# Patient Record
Sex: Male | Born: 1950 | Race: White | Hispanic: No | Marital: Single | Smoking: Never smoker
Health system: Southern US, Community
[De-identification: ages and names within clinical notes are randomized; demographics above are authoritative.]

## PROBLEM LIST (undated history)

## (undated) DIAGNOSIS — G039 Meningitis, unspecified: Secondary | ICD-10-CM

## (undated) DIAGNOSIS — I4892 Unspecified atrial flutter: Secondary | ICD-10-CM

## (undated) DIAGNOSIS — K754 Autoimmune hepatitis: Secondary | ICD-10-CM

## (undated) DIAGNOSIS — C61 Malignant neoplasm of prostate: Secondary | ICD-10-CM

## (undated) HISTORY — DX: Unspecified atrial flutter: I48.92

## (undated) HISTORY — DX: Autoimmune hepatitis: K75.4

---

## 1971-12-30 HISTORY — PX: HYDROCELE EXCISION: SHX482

## 2001-09-07 ENCOUNTER — Encounter: Admission: RE | Admit: 2001-09-07 | Discharge: 2001-09-07 | Payer: Self-pay | Admitting: Family Medicine

## 2001-09-30 ENCOUNTER — Encounter: Admission: RE | Admit: 2001-09-30 | Discharge: 2001-09-30 | Payer: Self-pay | Admitting: Sports Medicine

## 2003-09-19 ENCOUNTER — Encounter: Admission: RE | Admit: 2003-09-19 | Discharge: 2003-09-19 | Payer: Self-pay | Admitting: Family Medicine

## 2003-09-26 ENCOUNTER — Ambulatory Visit (HOSPITAL_COMMUNITY): Admission: RE | Admit: 2003-09-26 | Discharge: 2003-09-26 | Payer: Self-pay | Admitting: Sports Medicine

## 2006-05-28 ENCOUNTER — Ambulatory Visit: Payer: Self-pay | Admitting: Sports Medicine

## 2006-06-19 ENCOUNTER — Ambulatory Visit: Payer: Self-pay | Admitting: Family Medicine

## 2015-09-09 ENCOUNTER — Emergency Department (HOSPITAL_COMMUNITY)
Admission: EM | Admit: 2015-09-09 | Discharge: 2015-09-09 | Discharge status: Against Medical Advice | Payer: Self-pay | Attending: Emergency Medicine | Admitting: Emergency Medicine

## 2015-09-09 ENCOUNTER — Encounter (HOSPITAL_COMMUNITY): Payer: Self-pay | Admitting: Emergency Medicine

## 2015-09-09 DIAGNOSIS — R112 Nausea with vomiting, unspecified: Secondary | ICD-10-CM | POA: Insufficient documentation

## 2015-09-09 DIAGNOSIS — R42 Dizziness and giddiness: Secondary | ICD-10-CM | POA: Insufficient documentation

## 2015-09-09 LAB — COMPREHENSIVE METABOLIC PANEL
ALK PHOS: 79 U/L (ref 38–126)
ALT: 27 U/L (ref 17–63)
AST: 29 U/L (ref 15–41)
Albumin: 4.2 g/dL (ref 3.5–5.0)
Anion gap: 10 (ref 5–15)
BILIRUBIN TOTAL: 0.8 mg/dL (ref 0.3–1.2)
BUN: 17 mg/dL (ref 6–20)
CALCIUM: 9.4 mg/dL (ref 8.9–10.3)
CO2: 24 mmol/L (ref 22–32)
CREATININE: 1.07 mg/dL (ref 0.61–1.24)
Chloride: 105 mmol/L (ref 101–111)
Glucose, Bld: 176 mg/dL — ABNORMAL HIGH (ref 65–99)
Potassium: 4.6 mmol/L (ref 3.5–5.1)
Sodium: 139 mmol/L (ref 135–145)
TOTAL PROTEIN: 7 g/dL (ref 6.5–8.1)

## 2015-09-09 LAB — URINALYSIS, ROUTINE W REFLEX MICROSCOPIC
BILIRUBIN URINE: NEGATIVE
Glucose, UA: NEGATIVE mg/dL
Hgb urine dipstick: NEGATIVE
KETONES UR: 40 mg/dL — AB
NITRITE: NEGATIVE
PH: 7.5 (ref 5.0–8.0)
PROTEIN: 30 mg/dL — AB
Specific Gravity, Urine: 1.029 (ref 1.005–1.030)
UROBILINOGEN UA: 0.2 mg/dL (ref 0.0–1.0)

## 2015-09-09 LAB — CBC
HCT: 43.4 % (ref 39.0–52.0)
Hemoglobin: 14.9 g/dL (ref 13.0–17.0)
MCH: 31.3 pg (ref 26.0–34.0)
MCHC: 34.3 g/dL (ref 30.0–36.0)
MCV: 91.2 fL (ref 78.0–100.0)
PLATELETS: 301 10*3/uL (ref 150–400)
RBC: 4.76 MIL/uL (ref 4.22–5.81)
RDW: 12.7 % (ref 11.5–15.5)
WBC: 11.2 10*3/uL — AB (ref 4.0–10.5)

## 2015-09-09 LAB — URINE MICROSCOPIC-ADD ON

## 2015-09-09 LAB — LIPASE, BLOOD: LIPASE: 18 U/L — AB (ref 22–51)

## 2015-09-09 NOTE — ED Notes (Signed)
Upon being called to a room, Pt was extremely rude to [place] [facility] and colorfully stated that he no longer wants to be seen.

## 2015-09-09 NOTE — ED Notes (Signed)
This am felt dizzy, laid down and took 2 ASA with coffee had emesis 8 no dark emesis. N/v weakness. Went to urgent care and they called ems to transport.

## 2015-09-09 NOTE — ED Notes (Signed)
Bed: EE10 Expected date:  Expected time:  Means of arrival:  Comments: Satcher

## 2015-10-23 ENCOUNTER — Encounter: Payer: Self-pay | Admitting: Neurology

## 2015-10-23 ENCOUNTER — Ambulatory Visit (INDEPENDENT_AMBULATORY_CARE_PROVIDER_SITE_OTHER): Payer: Medicare Other | Admitting: Neurology

## 2015-10-23 VITALS — BP 118/78 | HR 63 | Resp 16 | Wt 169.0 lb

## 2015-10-23 DIAGNOSIS — H8120 Vestibular neuronitis, unspecified ear: Secondary | ICD-10-CM

## 2015-10-23 DIAGNOSIS — R42 Dizziness and giddiness: Secondary | ICD-10-CM | POA: Diagnosis not present

## 2015-10-23 NOTE — Patient Instructions (Signed)
1. Your exam and MRI brain look good 2. Follow-up on as needed basis, call our office for any changes

## 2015-10-23 NOTE — Progress Notes (Signed)
NEUROLOGY CONSULTATION NOTE  Blake [name] MRN: [id number] DOB: [birth year]  Referring provider: Dr. [name] Primary care provider: Dr. [name]  Reason for consult:  dizziness  Dear Dr Arelia Sneddon:  Thank you for your kind referral of Blake [name] for consultation of the above symptoms. Although his history is well known to you, please allow me to reiterate it for the purpose of our medical record.Records and images were personally reviewed where available.  HISTORY OF PRESENT ILLNESS: This is a 64 year old right-handed man with no significant past medical history, in his usual state of health until 09/09/15 while working around the house he suddenly felt dizzy described as "weaviness" and developed a cold sweat. He called his friend to bring him to urgent care, and en route started becoming nauseated and vomiting. He was retching and uncomfortable, and was sent to [facility] In the ambulance, he was started on an IV, unsure if he was given an anti-emetic. He was brought to an exam room and had an EKG, then moved to the waiting room where he was uncomfortable and retching for 4 hours. He was very unhappy with ER care and left, still feeling dizzy and tired, but no further nausea. He felt better the next day, ran 7 miles and had breakfast, then again started having nausea and dry heaving. He lay down on the grass again feeling the same dizziness. He saw his PCP on 09/11/15 and had an MRI brain that day. I personally reviewed MRI brain without contrast which was normal. He reports the dizziness continued for another 10-13 days then self-resolved. He has been asymptomatic for the past 2-3 weeks and feels back to his normal self. He denies any head injuries, recent infections, or recent travels. He did recall that 3 days prior to the incident, he noticed clicking of his jaw every time he would strike his foot while running, improving when he opens his jaw. He denies any headaches,  diplopia, dysarthria, dysphagia, focal numbness/tingling/weakness, bowel/bladder dysfunction. He has some crepitus in his neck.   Laboratory Data: Lab Results  Component Value Date   WBC 11.2* 09/09/2015   HGB 14.9 09/09/2015   HCT 43.4 09/09/2015   MCV 91.2 09/09/2015   PLT 301 09/09/2015     Chemistry      Component Value Date/Time   NA 139 09/09/2015 1233   K 4.6 09/09/2015 1233   CL 105 09/09/2015 1233   CO2 24 09/09/2015 1233   BUN 17 09/09/2015 1233   CREATININE 1.07 09/09/2015 1233      Component Value Date/Time   CALCIUM 9.4 09/09/2015 1233   ALKPHOS 79 09/09/2015 1233   AST 29 09/09/2015 1233   ALT 27 09/09/2015 1233   BILITOT 0.8 09/09/2015 1233      PAST MEDICAL HISTORY: No past medical history on file.  PAST SURGICAL HISTORY: Past Surgical History  Procedure Laterality Date  . Hydrocele excision  1973    MEDICATIONS: No current outpatient prescriptions on file prior to visit.   No current facility-administered medications on file prior to visit.    ALLERGIES: No Known Allergies  FAMILY HISTORY: No family history on file.  SOCIAL HISTORY: Social History   Social History  . Marital Status: Single    Spouse Name: N/A  . Number of Children: N/A  . Years of Education: N/A   Occupational History  . Massage Therapist    Social History Main Topics  . Smoking status: Never Smoker   .  Smokeless tobacco: Never Used  . Alcohol Use: 0.0 oz/week    0 Standard drinks or equivalent per week     Comment: occ  . Drug Use: No  . Sexual Activity: Not on file   Other Topics Concern  . Not on file   Social History Narrative    REVIEW OF SYSTEMS: Constitutional: No fevers, chills, or sweats, no generalized fatigue, change in appetite Eyes: No visual changes, double vision, eye pain Ear, nose and throat: No hearing loss, ear pain, nasal congestion, sore throat Cardiovascular: No chest pain, palpitations Respiratory:  No shortness of breath at  rest or with exertion, wheezes GastrointestinaI: No nausea, vomiting, diarrhea, abdominal pain, fecal incontinence Genitourinary:  No dysuria, urinary retention or frequency Musculoskeletal:  No neck pain, back pain Integumentary: No rash, pruritus, skin lesions Neurological: as above Psychiatric: No depression, insomnia, anxiety Endocrine: No palpitations, fatigue, diaphoresis, mood swings, change in appetite, change in weight, increased thirst Hematologic/Lymphatic:  No anemia, purpura, petechiae. Allergic/Immunologic: no itchy/runny eyes, nasal congestion, recent allergic reactions, rashes  PHYSICAL EXAM: Filed Vitals:   10/23/15 0844  BP: 118/78  Pulse: 63  Resp: 16   General: No acute distress Head:  Normocephalic/atraumatic Eyes: Fundoscopic exam shows bilateral sharp discs, no vessel changes, exudates, or hemorrhages Neck: supple, no paraspinal tenderness, full range of motion Back: No paraspinal tenderness Heart: regular rate and rhythm Lungs: Clear to auscultation bilaterally. Vascular: No carotid bruits. Skin/Extremities: No rash, no edema Neurological Exam: Mental status: alert and oriented to person, place, and time, no dysarthria or aphasia, Fund of knowledge is appropriate.  Recent and remote memory are intact.  Attention and concentration are normal.    Able to name objects and repeat phrases. Cranial nerves: CN I: not tested CN II: pupils equal, round and reactive to light, visual fields intact, fundi unremarkable. CN III, IV, VI:  full range of motion, no nystagmus, no ptosis CN V: facial sensation intact CN VII: upper and lower face symmetric CN VIII: hearing intact to finger rub CN IX, X: gag intact, uvula midline CN XI: sternocleidomastoid and trapezius muscles intact CN XII: tongue midline Bulk & Tone: normal, no fasciculations. Motor: 5/5 throughout with no pronator drift. Sensation: intact to light touch, cold, pin, vibration and joint position sense.   No extinction to double simultaneous stimulation.  Romberg test negative Deep Tendon Reflexes: +2 throughout, no ankle clonus Plantar responses: downgoing bilaterally Cerebellar: no incoordination on finger to nose, heel to shin. No dysdiadochokinesia Gait: narrow-based and steady, able to tandem walk adequately. Tremor: none  IMPRESSION: This is a 64 year old right-handed man with no significant past medical history, who had a 2-week bout of dizziness last month described as "weaviness, not true spinning." This was associated with nausea, dry heaving, and vomiting initially. His brain MRI without contrast is normal, no infarct or mass lesion seen. Symptoms resolved 2-3 weeks ago, his neurological exam today is normal. We discussed that symptoms are suggestive of possible vestibular neuritis. We discussed diagnosis and prognosis, and very low likelihood of recurrence. All his questions were answered to the best of my abilities. He will follow-up on an as needed basis and knows to call our office for any changes.   Thank you for allowing me to participate in the care of this patient. Please do not hesitate to call for any questions or concerns.   Ellouise Newer, M.D.  CC: Dr. Arelia Sneddon

## 2015-10-26 DIAGNOSIS — H8120 Vestibular neuronitis, unspecified ear: Secondary | ICD-10-CM | POA: Insufficient documentation

## 2015-10-26 DIAGNOSIS — R42 Dizziness and giddiness: Secondary | ICD-10-CM | POA: Insufficient documentation

## 2016-10-27 ENCOUNTER — Other Ambulatory Visit: Payer: Self-pay | Admitting: Family Medicine

## 2016-10-27 DIAGNOSIS — R17 Unspecified jaundice: Secondary | ICD-10-CM

## 2016-11-04 ENCOUNTER — Ambulatory Visit
Admission: RE | Admit: 2016-11-04 | Discharge: 2016-11-04 | Disposition: A | Discharge status: Home / Self Care | Payer: Medicare Other | Source: Ambulatory Visit | Attending: Family Medicine | Admitting: Family Medicine

## 2016-11-04 DIAGNOSIS — R17 Unspecified jaundice: Secondary | ICD-10-CM

## 2016-11-06 ENCOUNTER — Other Ambulatory Visit: Payer: Self-pay | Admitting: Family Medicine

## 2016-11-07 ENCOUNTER — Other Ambulatory Visit: Payer: Self-pay | Admitting: Family Medicine

## 2016-11-07 ENCOUNTER — Ambulatory Visit
Admission: RE | Admit: 2016-11-07 | Discharge: 2016-11-07 | Disposition: A | Discharge status: Home / Self Care | Payer: Medicare Other | Source: Ambulatory Visit | Attending: Family Medicine | Admitting: Family Medicine

## 2016-11-07 DIAGNOSIS — K7200 Acute and subacute hepatic failure without coma: Secondary | ICD-10-CM

## 2016-11-07 MED ORDER — GADOBENATE DIMEGLUMINE 529 MG/ML IV SOLN
14.0000 mL | Freq: Once | INTRAVENOUS | Status: AC | PRN
  Administered 2016-11-07: 14 mL via INTRAVENOUS

## 2016-12-29 DIAGNOSIS — Z8679 Personal history of other diseases of the circulatory system: Secondary | ICD-10-CM

## 2016-12-29 HISTORY — DX: Personal history of other diseases of the circulatory system: Z86.79

## 2017-03-26 ENCOUNTER — Other Ambulatory Visit (HOSPITAL_COMMUNITY): Payer: Self-pay | Admitting: Family Medicine

## 2017-03-26 DIAGNOSIS — R188 Other ascites: Secondary | ICD-10-CM

## 2017-03-26 DIAGNOSIS — K754 Autoimmune hepatitis: Secondary | ICD-10-CM

## 2017-03-27 ENCOUNTER — Ambulatory Visit (HOSPITAL_COMMUNITY)
Admission: RE | Admit: 2017-03-27 | Discharge: 2017-03-27 | Disposition: A | Discharge status: Home / Self Care | Payer: Medicare Other | Source: Ambulatory Visit | Attending: Family Medicine | Admitting: Family Medicine

## 2017-03-27 DIAGNOSIS — K754 Autoimmune hepatitis: Secondary | ICD-10-CM | POA: Diagnosis not present

## 2017-03-27 DIAGNOSIS — R188 Other ascites: Secondary | ICD-10-CM | POA: Diagnosis present

## 2017-03-27 HISTORY — PX: IR PARACENTESIS: IMG2679

## 2017-03-27 LAB — LACTATE DEHYDROGENASE, PLEURAL OR PERITONEAL FLUID: LD FL: 56 U/L — AB (ref 3–23)

## 2017-03-27 LAB — GRAM STAIN

## 2017-03-27 LAB — AMYLASE, PLEURAL OR PERITONEAL FLUID: AMYLASE FL: 44 U/L

## 2017-03-27 LAB — GLUCOSE, PLEURAL OR PERITONEAL FLUID: Glucose, Fluid: 151 mg/dL

## 2017-03-27 LAB — BODY FLUID CELL COUNT WITH DIFFERENTIAL
LYMPHS FL: 10 %
MONOCYTE-MACROPHAGE-SEROUS FLUID: 85 % (ref 50–90)
NEUTROPHIL FLUID: 5 % (ref 0–25)
WBC FLUID: 117 uL (ref 0–1000)

## 2017-03-27 LAB — PROTEIN, PLEURAL OR PERITONEAL FLUID: Total protein, fluid: <3 g/dL

## 2017-03-27 NOTE — Procedures (Signed)
Ultrasound-guided diagnostic and therapeutic paracentesis performed yielding 4.3 liters of turbid, yellow fluid. No immediate complications. A portion of the fluid was submitted to the lab for preordered studies.

## 2017-04-01 DIAGNOSIS — K7460 Unspecified cirrhosis of liver: Secondary | ICD-10-CM | POA: Insufficient documentation

## 2017-04-01 DIAGNOSIS — R188 Other ascites: Secondary | ICD-10-CM | POA: Insufficient documentation

## 2017-04-01 LAB — CULTURE, BODY FLUID W GRAM STAIN -BOTTLE

## 2017-04-01 LAB — CULTURE, BODY FLUID-BOTTLE: CULTURE: NO GROWTH

## 2017-04-28 DIAGNOSIS — B457 Disseminated cryptococcosis: Secondary | ICD-10-CM

## 2017-04-28 HISTORY — DX: Disseminated cryptococcosis: B45.7

## 2017-05-21 DIAGNOSIS — B451 Cerebral cryptococcosis: Secondary | ICD-10-CM | POA: Insufficient documentation

## 2017-08-04 ENCOUNTER — Telehealth: Payer: Self-pay | Admitting: Cardiovascular Disease

## 2017-08-04 NOTE — Telephone Encounter (Signed)
08/04/17-Received incoming records from [facility] for upcoming appointment on 08/26/17 @ 2:20pm with Dr. Oval Linsey. Records given to [facility] in Medical Records. ab

## 2017-08-07 ENCOUNTER — Telehealth: Payer: Self-pay | Admitting: Cardiovascular Disease

## 2017-08-07 NOTE — Telephone Encounter (Signed)
Received incoming records from [facility] for upcoming appointment on 08/26/17 @ 2:20pm with Dr. Oval Linsey. Records given to [facility] in Medical Records. 08/07/17 ab

## 2017-08-26 ENCOUNTER — Ambulatory Visit (INDEPENDENT_AMBULATORY_CARE_PROVIDER_SITE_OTHER): Payer: Medicare Other | Admitting: Cardiovascular Disease

## 2017-08-26 ENCOUNTER — Encounter: Payer: Self-pay | Admitting: Cardiovascular Disease

## 2017-08-26 VITALS — BP 125/78 | HR 73 | Ht 68.0 in | Wt 157.0 lb

## 2017-08-26 DIAGNOSIS — I4892 Unspecified atrial flutter: Secondary | ICD-10-CM | POA: Diagnosis not present

## 2017-08-26 DIAGNOSIS — I483 Typical atrial flutter: Secondary | ICD-10-CM

## 2017-08-26 DIAGNOSIS — K754 Autoimmune hepatitis: Secondary | ICD-10-CM | POA: Diagnosis not present

## 2017-08-26 MED ORDER — METOPROLOL TARTRATE 25 MG PO TABS: ORAL_TABLET | ORAL | 5 refills | Status: DC

## 2017-08-26 NOTE — Progress Notes (Signed)
Cardiology Office Note   Date:  08/28/2017   ID:  Blake, [name] [birth year], MRN [id number]  PCP:  Hayden Rasmussen, MD  Cardiologist:   Skeet Latch, MD  Gastroenterologist: Dr. [place] Campbell [facility])   No chief complaint on file.    History of Present Illness: Blake [name] is a 66 y.o. male with paroxysmal atrial fiutter, disseminated cryptococcal infection, and autoimmune hepatitis who is being seen today for the evaluation of atrial flutter at the request of Blake [name], *.  Blake [name] has been on high dose steroids for the management of autoimmune hepatitis which was diagnosed in early 2018.  He has required one diagnostic and one therapeutic paracentesis.  He had a prolonged hospitalization at [facility] 04/2017 with disseminated cryptococcus (CNS, pulmonary and L parotid).  During that hospitalization he was noted to be in atrial flutter by EKG on 05/19/17.  His hospitalization was also complicated by mouth ulcers and colitis.  His symptoms have all resolved with the use of prednisone.  He was ill for several weeks prior to finding out that cryptococcus was the source of his infection.  During that time prior to his hospitalization he was noted to be in atrial flutter with his PCP.  He was not started on anticoagulation and his heart rate was well-controlled.  In retrospect he thinks that he may be been in and out of atrial flutter prior to these findings.  At times his heart rate monitor alerted him that his heart rate was 150 bpm while at rest.  He was otherwise unaware of his arrhythmia. At times he notes dizziness and wonders if this could be the atrial flutter.  At baseline his heart rate is usually in the 60s.  He did not recall having an echo.  While in the hospital he had lower extremity edema, but this has resolved.    Prior to his diagnosis of autoimmune hepatitis [name] was very active.  He enjoyed running half marathons.  He started back running two weeks ago and is back  up to 4-5 miles.  He is happy that he is starting to regain muscle tone.  He is weaning prednisone and is currently taking 10 mg daily.  He has noted easy bruising and bleeding, which he attributes to the prednisone.  His liver function had normalized when recently checked by his gastroenterologist.     Past Medical History:  Diagnosis Date  . Atrial flutter ([place]) 08/28/2017  . Autoimmune hepatitis ([place]) 08/28/2017    Past Surgical History:  Procedure Laterality Date  . HYDROCELE EXCISION  1973     Current Outpatient Prescriptions  Medication Sig Dispense Refill  . fluconazole (DIFLUCAN) 200 MG tablet Take 1 tablet by mouth daily.  0  . predniSONE (DELTASONE) 10 MG tablet Take 10 mg by mouth daily.  2  . spironolactone (ALDACTONE) 25 MG tablet Take 50 mg by mouth daily.    . metoprolol tartrate (LOPRESSOR) 25 MG tablet 1/2 TABLET BY MOUTH TWICE A DAY 30 tablet 5   No current facility-administered medications for this visit.     Allergies:   Patient has no known allergies.    Social History:  The patient  reports that he has never smoked. He has never used smokeless tobacco. He reports that he drinks alcohol. He reports that he does not use drugs.   Family History:  The patient's family history includes Alzheimer's disease in his father; Breast cancer in his sister; Heart  disease in his father; Hypertension in his brother; Liver disease in his sister; Parkinson's disease in his mother; Prostate cancer in his father; Ulcerative colitis in his sister.    ROS:  Please see the history of present illness.   Otherwise, review of systems are positive for none.   All other systems are reviewed and negative.    PHYSICAL EXAM: VS:  BP 125/78   Pulse 73   Ht 5\' 8"  (1.727 m)   Wt 71.2 kg (157 lb)   BMI 23.87 kg/m  , BMI Body mass index is 23.87 kg/m. GENERAL:  Well appearing HEENT:  Pupils equal round and reactive, fundi not visualized, oral mucosa unremarkable NECK:  No jugular venous  distention, waveform within normal limits, carotid upstroke brisk and symmetric, no bruits LUNGS:  Clear to auscultation bilaterally.  No crackles, wheezes or rhonchi HEART:  RRR.  PMI not displaced or sustained,S1 and S2 within normal limits, no S3, no S4, no clicks, no rubs, no murmurs ABD:  Mildly distended.  +fluid wave.  Positive bowel sounds normal in frequency in pitch, no bruits, no rebound, no guarding, no midline pulsatile mass, no hepatomegaly, no splenomegaly.  Umbilical hernia. EXT:  2 plus pulses throughout, no edema, no cyanosis no clubbing SKIN:  No rashes no nodules NEURO:  Cranial nerves II through XII grossly intact, motor grossly intact throughout PSYCH:  Cognitively intact, oriented to person place and time   EKG:  EKG is ordered today. The ekg ordered 08/26/17 demonstrates sinus rhythm.  Rate 129 bpm.   04/11/17: Atrial flutter with variable ventricular response   Recent Labs: No results found for requested labs within last 8760 hours.    Lipid Panel No results found for: CHOL, TRIG, HDL, CHOLHDL, VLDL, LDLCALC, LDLDIRECT    Wt Readings from Last 3 Encounters:  08/26/17 71.2 kg (157 lb)  10/23/15 76.7 kg (169 lb)      ASSESSMENT AND PLAN:  # Paroxysmal atrial flutter:  Blake [name] has experienced episodes of atrial flutter.  These occurred in the setting of disseminated cryptococcal infection.  It is unclear if this was going on prior to his infection.  His liver function (and presumably his clotting factors) have recently returned to normal.  He has normal platelet counts.  He is currently in sinus rhythm today.  We will get an echo and check TSH and free T4.  He has also experienced episodes of RVR.  We will start metoprolol tartrate 12.5mg  bid.  Given his CHA2DS2-Vasc score of 1, his overall risk of stroke is pretty low.  While he is on prednisone, his risk of bleeding is significantly higher.  He is covered in ecchymoses and bleeds.  Prednisone is being actively  weaned. We will wait for him to get off prednisone prior to starting anticoagulation.   This patients CHA2DS2-VASc Score and unadjusted Ischemic Stroke Rate (% per year) is equal to 0.6 % stroke rate/year from a score of 1  Above score calculated as 1 point each if present [CHF, HTN, DM, Vascular=MI/PAD/Aortic Plaque, Age if 65-74, or Male] Above score calculated as 2 points each if present [Age > 75, or Stroke/TIA/TE]     Current medicines are reviewed at length with the patient today.  The patient does not have concerns regarding medicines.  The following changes have been made:  Start metoprolol.  Labs/ tests ordered today include:   Orders Placed This Encounter  Procedures  . T4, free  . TSH  . EKG 12-Lead  .  ECHOCARDIOGRAM COMPLETE     Disposition:   FU with Zareya Tuckett C. Oval Linsey, MD, [facility] in 2 months.    This note was written with the assistance of speech recognition software.  Please excuse any transcriptional errors.  Signed, Dreya Buhrman C. Oval Linsey, MD, [facility]  08/28/2017 9:29 AM    [place]

## 2017-08-26 NOTE — Patient Instructions (Addendum)
Medication Instructions:  START METOPROLOL TARTRATE 25 MG 1/2 TABLET TWICE A DAY   Labwork: TSH/FT4 WHEN YOU GET YOUR ECHO    Testing/Procedures: Your physician has requested that you have an echocardiogram. Echocardiography is a painless test that uses sound waves to create images of your heart. It provides your doctor with information about the size and shape of your heart and how well your heart's chambers and valves are working. This procedure takes approximately one hour. There are no restrictions for this procedure. [address]   Follow-Up: Your physician recommends that you schedule a follow-up appointment in: 2 MONTH OV   If you need a refill on your cardiac medications before your next appointment, please call your pharmacy.  Echocardiogram An echocardiogram, or echocardiography, uses sound waves (ultrasound) to produce an image of your heart. The echocardiogram is simple, painless, obtained within a short period of time, and offers valuable information to your health care provider. The images from an echocardiogram can provide information such as:  Evidence of coronary artery disease (CAD).  Heart size.  Heart muscle function.  Heart valve function.  Aneurysm detection.  Evidence of a past heart attack.  Fluid buildup around the heart.  Heart muscle thickening.  Assess heart valve function.  Tell a health care provider about:  Any allergies you have.  All medicines you are taking, including vitamins, herbs, eye drops, creams, and over-the-counter medicines.  Any problems you or family members have had with anesthetic medicines.  Any blood disorders you have.  Any surgeries you have had.  Any medical conditions you have.  Whether you are pregnant or may be pregnant. What happens before the procedure? No special preparation is needed. Eat and drink normally. What happens during the procedure?  In order to produce an image  of your heart, gel will be applied to your chest and a wand-like tool (transducer) will be moved over your chest. The gel will help transmit the sound waves from the transducer. The sound waves will harmlessly bounce off your heart to allow the heart images to be captured in real-time motion. These images will then be recorded.  You may need an IV to receive a medicine that improves the quality of the pictures. What happens after the procedure? You may return to your normal schedule including diet, activities, and medicines, unless your health care provider tells you otherwise. This information is not intended to replace advice given to you by your health care provider. Make sure you discuss any questions you have with your health care provider. Document Released: 12/12/2000 Document Revised: 08/02/2016 Document Reviewed: 08/22/2013 Elsevier Interactive Patient Education  2017 [employer].

## 2017-08-28 ENCOUNTER — Encounter: Payer: Self-pay | Admitting: Cardiovascular Disease

## 2017-08-28 DIAGNOSIS — K754 Autoimmune hepatitis: Secondary | ICD-10-CM

## 2017-08-28 DIAGNOSIS — I4892 Unspecified atrial flutter: Secondary | ICD-10-CM

## 2017-08-28 HISTORY — DX: Autoimmune hepatitis: K75.4

## 2017-08-28 HISTORY — DX: Unspecified atrial flutter: I48.92

## 2017-09-04 ENCOUNTER — Other Ambulatory Visit: Payer: Medicare Other | Admitting: *Deleted

## 2017-09-04 ENCOUNTER — Other Ambulatory Visit: Payer: Self-pay

## 2017-09-04 ENCOUNTER — Ambulatory Visit (HOSPITAL_COMMUNITY): Payer: Medicare Other | Attending: Cardiology

## 2017-09-04 DIAGNOSIS — I4892 Unspecified atrial flutter: Secondary | ICD-10-CM

## 2017-09-04 DIAGNOSIS — I080 Rheumatic disorders of both mitral and aortic valves: Secondary | ICD-10-CM | POA: Diagnosis not present

## 2017-09-04 LAB — TSH: TSH: 1.34 u[IU]/mL (ref 0.450–4.500)

## 2017-09-04 LAB — T4, FREE: FREE T4: 0.91 ng/dL (ref 0.82–1.77)

## 2017-10-01 DIAGNOSIS — N3090 Cystitis, unspecified without hematuria: Secondary | ICD-10-CM | POA: Insufficient documentation

## 2017-10-26 ENCOUNTER — Ambulatory Visit: Payer: Medicare Other | Admitting: Cardiovascular Disease

## 2017-11-13 ENCOUNTER — Ambulatory Visit: Payer: Medicare Other | Admitting: Cardiovascular Disease

## 2017-11-13 ENCOUNTER — Encounter: Payer: Self-pay | Admitting: Cardiovascular Disease

## 2017-11-13 VITALS — BP 90/58 | HR 70 | Ht 68.0 in | Wt 156.0 lb

## 2017-11-13 DIAGNOSIS — I4892 Unspecified atrial flutter: Secondary | ICD-10-CM | POA: Diagnosis not present

## 2017-11-13 NOTE — Progress Notes (Signed)
Cardiology Office Note   Date:  11/15/2017   ID:  Blake [name], DOB Dec 16, 1951, MRN [id number]  PCP:  Hayden Rasmussen, MD  Cardiologist:   Skeet Latch, MD  Gastroenterologist: Dr. Richmond Campbell [facility])   Chief Complaint  [name] presents with  . Follow-up     History of Present Illness: Blake [name] is a 66 y.o. male with paroxysmal atrial fiutter, disseminated cryptococcal infection, and autoimmune hepatitis here for follow-up.  He was initially seen for atrial flutter 07/2017. Blake [name] has been on high dose steroids for Blake management of autoimmune hepatitis which was diagnosed in early 2018.  He has required one diagnostic and one therapeutic paracentesis.  He had a prolonged hospitalization at [facility] 04/2017 with disseminated cryptococcus (CNS, pulmonary and L parotid).  During that hospitalization he was noted to be in atrial flutter by EKG on 05/19/17.  His hospitalization was also complicated by mouth ulcers and colitis.  His symptoms have all resolved with Blake use of prednisone.  He was ill for several weeks prior to finding out that cryptococcus was Blake source of his infection.  During that time prior to his hospitalization he was noted to be in atrial flutter with his PCP.  He was not started on anticoagulation and his heart rate was well-controlled.    At his last appointment Blake [name] was not started on anticoagulation because he was on chronic prednisone.  He was referred for an echo 09/04/17 that revealed LVEF 55-60% with grade 1 diastolic dysfunction, mild aortic regurgitation, and mild mitral regurgitation.  Since that appointment he has been hospitalized 3 times at [facility] for cystitis and sepsis.  Each time he failed oral antibiotic after discharge.  He was most recently discharged 10/19/17.  That hospitalization was complicated by acute renal failure with a creatinine that rose to 2.6 and improved to 1.46 at discharge.  Lasix and spironolactone were  held during his hospitalization.  This time he was discharged on both doxycycline and ciprofloxacin.  He has been feeling well.  He reports that his blood pressure has still been low.  He has some mild lightheadedness and dizziness but no syncope.  He continues to have urinary urgency but no dysuria.  He checked his blood pressure at Blake drugstore recently and it was 120/80.  He has not experienced any chest pain or palpitations but still feels weak.  He plans to see Dr. Earlean Shawl next week to discuss Blake plans for his autoimmune hepatitis treatment.  For now he continues on prednisone.  He has not had any increased lower extremity edema.  His ascites is stable.  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 Hi BP at home has been mostly in Blake 867E systolic.  He has occasional lightheadedness but no syncope.   Past Medical History:  Diagnosis Date  . Atrial flutter ([place]) 08/28/2017  . Autoimmune hepatitis ([place]) 08/28/2017    Past Surgical History:  Procedure Laterality Date  . HYDROCELE EXCISION  1973     Current Outpatient Medications  Medication Sig Dispense Refill  . acetaminophen (TYLENOL) 325 MG tablet Take 650 mg every 12 (twelve) hours as needed by mouth.    . ciprofloxacin (CIPRO) 500 MG tablet Take 500 mg 2 (two) times daily by mouth.    . doxycycline (VIBRA-TABS) 100 MG tablet Take 100 mg 2 (two) times daily by mouth.    Marland Kitchen  fluconazole (DIFLUCAN) 200 MG tablet Take 1 tablet by mouth daily.  0  . metoprolol tartrate (LOPRESSOR) 25 MG tablet Take 25 mg as directed by mouth. 1/2 tablet by mouth twice a day    . Multiple Vitamin (MULTIVITAMIN) tablet Take 1 tablet daily by mouth.    . predniSONE (DELTASONE) 5 MG tablet Take 5 mg daily by mouth.  1  . tamsulosin (FLOMAX) 0.4 MG CAPS capsule Take 0.4 mg daily by mouth.     No current facility-administered medications for this visit.     Allergies:   [name] has no known allergies.    Social  History:  Blake [name]  reports that  has never smoked. he has never used smokeless tobacco. He reports that he drinks alcohol. He reports that he does not use drugs.   Family History:  Blake [name]'s family history includes Alzheimer's disease in his father; Breast cancer in his sister; Heart disease in his father; Hypertension in his brother; Liver disease in his sister; Parkinson's disease in his mother; Prostate cancer in his father; Ulcerative colitis in his sister.    ROS:  Please see Blake history of present illness.   Otherwise, review of systems are positive for none.   All other systems are reviewed and negative.    PHYSICAL EXAM: VS:  BP (!) 90/58   Pulse 70   Ht 5\' 8"  (1.727 m)   Wt 70.8 kg (156 lb)   BMI 23.72 kg/m  , BMI Body mass index is 23.72 kg/m. GENERAL:  Chronically ill-appearing HEENT: Pupils equal round and reactive, fundi not visualized, oral mucosa unremarkable NECK:  No jugular venous distention, waveform within normal limits, carotid upstroke brisk and symmetric, no bruits, no thyromegaly LUNGS:  Clear to auscultation bilaterally HEART:  RRR.  PMI not displaced or sustained,S1 and S2 within normal limits, no S3, no S4, no clicks, no rubs, no murmurs ABD:  +Abdominal distension, positive bowel sounds normal in frequency in pitch, no bruits, no rebound, no guarding, no midline pulsatile mass, no hepatomegaly, no splenomegaly EXT:  2 plus pulses throughout, no edema, no cyanosis no clubbing SKIN:  No rashes no nodules.  Multiple ecchymoses. NEURO:  Cranial nerves II through XII grossly intact, motor grossly intact throughout PSYCH:  Cognitively intact, oriented to person place and time   EKG:  EKG is ordered today. Blake ekg ordered 08/26/17 demonstrates sinus rhythm.  Rate 129 bpm.   04/11/17: Atrial flutter with variable ventricular response  Echo 09/04/17: Study Conclusions  - Left ventricle: Blake cavity size was normal. There was mild focal   basal hypertrophy of  Blake septum. Systolic function was normal.   Blake estimated ejection fraction was in Blake range of 55% to 60%.   Wall motion was normal; there were no regional wall motion   abnormalities. There was an increased relative contribution of   atrial contraction to ventricular filling. Doppler parameters are   consistent with abnormal left ventricular relaxation (grade 1   diastolic dysfunction). - Aortic valve: Mildly calcified annulus. Trileaflet; normal   thickness leaflets. There was mild regurgitation. - Mitral valve: There was mild regurgitation.   Recent Labs: 09/04/2017: TSH 1.340    Lipid Panel No results found for: CHOL, TRIG, HDL, CHOLHDL, VLDL, LDLCALC, LDLDIRECT    Wt Readings from Last 3 Encounters:  11/13/17 70.8 kg (156 lb)  08/26/17 71.2 kg (157 lb)  10/23/15 76.7 kg (169 lb)      ASSESSMENT AND PLAN:  # Paroxysmal atrial flutter:  No recent episodes.  He has been ill recently with multiple episode of cystitis and bacteremia.  He remains on prednisone for now.  If he gets off this we will discuss anticoagulation.  We discussed reducing metoprolol to once daily.  He is stable for now but can change if symptoms worsen. This patients CHA2DS2-VASc Score and unadjusted Ischemic Stroke Rate (% per year) is equal to 0.6 % stroke rate/year from a score of 1  Above score calculated as 1 point each if present [CHF, HTN, DM, Vascular=MI/PAD/Aortic Plaque, Age if 65-74, or Male] Above score calculated as 2 points each if present [Age > 75, or Stroke/TIA/TE]     Current medicines are reviewed at length with Blake [name] today.  Blake [name] does not have concerns regarding medicines.  Blake following changes have been made: none  Labs/ tests ordered today include:   No orders of Blake defined types were placed in this encounter.    Disposition:   FU with Blake Stoltzfus C. Oval Linsey, MD, [facility] in 4 months.    This note was written with Blake assistance of speech recognition software.   Please excuse any transcriptional errors.  Signed, Rhilynn Preyer C. Oval Linsey, MD, [facility]  11/15/2017 9:46 AM    [facility]

## 2017-11-13 NOTE — Patient Instructions (Signed)
Medication Instructions:  Your physician recommends that you continue on your current medications as directed. Please refer to the Current Medication list given to you today.  Labwork: none  Testing/Procedures: none  Follow-Up: Your physician recommends that you schedule a follow-up appointment in: 4 month ov   If you need a refill on your cardiac medications before your next appointment, please call your pharmacy.  

## 2017-11-15 ENCOUNTER — Encounter: Payer: Self-pay | Admitting: Cardiovascular Disease

## 2018-03-12 ENCOUNTER — Ambulatory Visit: Payer: Medicare Other | Admitting: Cardiovascular Disease

## 2018-03-13 DIAGNOSIS — M60021 Infective myositis, right upper arm: Secondary | ICD-10-CM | POA: Insufficient documentation

## 2018-03-13 DIAGNOSIS — N179 Acute kidney failure, unspecified: Secondary | ICD-10-CM | POA: Insufficient documentation

## 2018-03-20 ENCOUNTER — Other Ambulatory Visit: Payer: Self-pay | Admitting: Cardiovascular Disease

## 2018-03-22 NOTE — Telephone Encounter (Signed)
Please review for refill, Thanks !  

## 2019-02-22 ENCOUNTER — Other Ambulatory Visit: Payer: Self-pay | Admitting: Family Medicine

## 2019-02-24 ENCOUNTER — Other Ambulatory Visit: Payer: Self-pay | Admitting: Family Medicine

## 2019-02-24 DIAGNOSIS — K7460 Unspecified cirrhosis of liver: Secondary | ICD-10-CM

## 2019-08-10 ENCOUNTER — Other Ambulatory Visit (HOSPITAL_COMMUNITY): Payer: Self-pay | Admitting: Internal Medicine

## 2019-08-10 ENCOUNTER — Other Ambulatory Visit: Payer: Self-pay | Admitting: Internal Medicine

## 2019-08-10 DIAGNOSIS — K754 Autoimmune hepatitis: Secondary | ICD-10-CM

## 2019-08-10 DIAGNOSIS — R188 Other ascites: Secondary | ICD-10-CM

## 2019-08-16 ENCOUNTER — Other Ambulatory Visit: Payer: Self-pay

## 2019-08-16 ENCOUNTER — Ambulatory Visit (HOSPITAL_COMMUNITY)
Admission: RE | Admit: 2019-08-16 | Discharge: 2019-08-16 | Disposition: A | Discharge status: Home / Self Care | Payer: Medicare Other | Source: Ambulatory Visit | Attending: Internal Medicine | Admitting: Internal Medicine

## 2019-08-16 ENCOUNTER — Other Ambulatory Visit (HOSPITAL_COMMUNITY): Payer: Self-pay | Admitting: Internal Medicine

## 2019-08-16 DIAGNOSIS — K754 Autoimmune hepatitis: Secondary | ICD-10-CM

## 2019-08-16 DIAGNOSIS — Z8719 Personal history of other diseases of the digestive system: Secondary | ICD-10-CM | POA: Insufficient documentation

## 2019-08-16 DIAGNOSIS — D1803 Hemangioma of intra-abdominal structures: Secondary | ICD-10-CM | POA: Insufficient documentation

## 2019-08-16 DIAGNOSIS — N281 Cyst of kidney, acquired: Secondary | ICD-10-CM | POA: Diagnosis not present

## 2019-08-16 DIAGNOSIS — R188 Other ascites: Secondary | ICD-10-CM

## 2019-08-16 NOTE — Progress Notes (Signed)
IR requested by Rolene Course, PA-C for possible image-guided paracentesis.  Limited abdominal ultrasound revealed no fluid that could be safely accessed with procedure today. Images sent to Dr. Earleen Newport for review. Informed patient that procedure will not occur today. All questions answered and concerns addressed. Will make Rolene Course, PA-C aware.  IR available in future if needed.   Bea Graff Louk, PA-C 08/16/2019, 9:37 AM

## 2019-11-23 DIAGNOSIS — B999 Unspecified infectious disease: Secondary | ICD-10-CM | POA: Insufficient documentation

## 2020-10-23 DIAGNOSIS — K5790 Diverticulosis of intestine, part unspecified, without perforation or abscess without bleeding: Secondary | ICD-10-CM | POA: Insufficient documentation

## 2021-03-29 HISTORY — PX: OTHER SURGICAL HISTORY: SHX169

## 2021-08-29 DIAGNOSIS — R972 Elevated prostate specific antigen [PSA]: Secondary | ICD-10-CM

## 2021-08-29 HISTORY — DX: Elevated prostate specific antigen (PSA): R97.20

## 2021-10-17 ENCOUNTER — Other Ambulatory Visit: Payer: Self-pay | Admitting: Urology

## 2021-10-17 DIAGNOSIS — R972 Elevated prostate specific antigen [PSA]: Secondary | ICD-10-CM

## 2021-11-01 ENCOUNTER — Other Ambulatory Visit: Payer: Medicare Other

## 2021-11-13 ENCOUNTER — Ambulatory Visit
Admission: RE | Admit: 2021-11-13 | Discharge: 2021-11-13 | Disposition: A | Discharge status: Home / Self Care | Payer: Medicare Other | Source: Ambulatory Visit | Attending: Urology | Admitting: Urology

## 2021-11-13 DIAGNOSIS — R972 Elevated prostate specific antigen [PSA]: Secondary | ICD-10-CM

## 2021-11-13 MED ORDER — GADOBENATE DIMEGLUMINE 529 MG/ML IV SOLN
15.0000 mL | Freq: Once | INTRAVENOUS | Status: AC | PRN
  Administered 2021-11-13: 15 mL via INTRAVENOUS

## 2021-12-24 ENCOUNTER — Other Ambulatory Visit (HOSPITAL_COMMUNITY): Payer: Self-pay | Admitting: Urology

## 2021-12-24 DIAGNOSIS — C61 Malignant neoplasm of prostate: Secondary | ICD-10-CM

## 2021-12-25 ENCOUNTER — Telehealth: Payer: Self-pay | Admitting: Radiation Oncology

## 2021-12-26 ENCOUNTER — Telehealth: Payer: Self-pay | Admitting: Radiation Oncology

## 2022-01-07 ENCOUNTER — Encounter (HOSPITAL_COMMUNITY)
Admission: RE | Admit: 2022-01-07 | Discharge: 2022-01-07 | Disposition: A | Discharge status: Home / Self Care | Payer: Medicare Other | Source: Ambulatory Visit | Attending: Urology | Admitting: Urology

## 2022-01-07 ENCOUNTER — Other Ambulatory Visit: Payer: Self-pay

## 2022-01-07 DIAGNOSIS — C61 Malignant neoplasm of prostate: Secondary | ICD-10-CM | POA: Insufficient documentation

## 2022-01-07 MED ORDER — PIFLIFOLASTAT F 18 (PYLARIFY) INJECTION
9.0000 | Freq: Once | INTRAVENOUS | Status: AC
  Administered 2022-01-07: 9.8 via INTRAVENOUS

## 2022-01-23 NOTE — Progress Notes (Signed)
GU Location of Tumor / Histology: Prostate Ca  If Prostate Cancer, Gleason Score is (5 + 4) and PSA is (9.84 as of 12/22)  Biopsies  Dr. Junious Silk        Past/Anticipated interventions by urology, if any:   Weight changes, if any:  No  IPSS:   14 SHIM:  9  Bowel/Bladder complaints, if any:  No bowel issue and urinary frequency.  Nausea/Vomiting, if any:  No  Pain issues, if any:  0/10  SAFETY ISSUES: Prior radiation?  No Pacemaker/ICD?  No Possible current pregnancy?  Male Is the patient on methotrexate?  No  Current Complaints / other details:  Need more information on treatment.

## 2022-01-28 ENCOUNTER — Ambulatory Visit
Admission: RE | Admit: 2022-01-28 | Discharge: 2022-01-28 | Disposition: A | Discharge status: Home / Self Care | Payer: Medicare Other | Source: Ambulatory Visit | Attending: Radiation Oncology | Admitting: Radiation Oncology

## 2022-01-28 ENCOUNTER — Other Ambulatory Visit: Payer: Self-pay

## 2022-01-28 VITALS — BP 125/84 | HR 60 | Temp 97.8°F | Resp 20 | Ht 68.0 in | Wt 165.0 lb

## 2022-01-28 DIAGNOSIS — C61 Malignant neoplasm of prostate: Secondary | ICD-10-CM | POA: Insufficient documentation

## 2022-01-28 DIAGNOSIS — Z803 Family history of malignant neoplasm of breast: Secondary | ICD-10-CM | POA: Diagnosis not present

## 2022-01-28 DIAGNOSIS — Z8042 Family history of malignant neoplasm of prostate: Secondary | ICD-10-CM | POA: Diagnosis not present

## 2022-01-28 DIAGNOSIS — I480 Paroxysmal atrial fibrillation: Secondary | ICD-10-CM | POA: Diagnosis not present

## 2022-01-28 DIAGNOSIS — K754 Autoimmune hepatitis: Secondary | ICD-10-CM | POA: Insufficient documentation

## 2022-01-28 DIAGNOSIS — N281 Cyst of kidney, acquired: Secondary | ICD-10-CM | POA: Insufficient documentation

## 2022-01-28 DIAGNOSIS — I4892 Unspecified atrial flutter: Secondary | ICD-10-CM | POA: Diagnosis not present

## 2022-01-28 NOTE — Progress Notes (Signed)
Introduced myself to the patient as the prostate nurse navigator.  No barriers to care identified at this time.  He is here to discuss his radiation treatment options.  I gave him my business card and asked him to call me with questions or concerns.  Verbalized understanding.  ?

## 2022-01-28 NOTE — Progress Notes (Signed)
Radiation Oncology         [phone] ________________________________  Initial Outpatient Consultation  Name: Blake [name] MRN: [id number]  Date: 01/28/2022  DOB: Jan 11, 1951  XN:[id number], Blake Munroe, MD  Festus Aloe, MD   REFERRING PHYSICIAN: Festus Aloe, MD  DIAGNOSIS: 71 y.o. 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    ICD-10-CM   1. Malignant neoplasm of prostate ([place])  C61       HISTORY OF PRESENT ILLNESS: Blake [name] is a 71 y.o. male with a diagnosis of prostate cancer. He has a history of elevated PSA at 6.8 in 2013 but a repeat PSA normalized to 4.11 and had remained in the normal range since that time. However, more more recently, he was noted to have an elevated PSA of 8.35 in 04/2021, by his primary care physician, Dr. Darron Doom.  Accordingly, he was referred for evaluation in urology by Dr. Junious Silk on 10/04/21,  digital rectal examination was performed at that time revealing no nodules. Repeat PSA that day showed further elevation to 9.84. He underwent prostate MRI on 11/13/21 showing a PI-RADS 4 lesion in the left posterolateral and posteromedial peripheral zone of the mid gland and apex, with potential extension into the left central zone and left seminal vesicle with >1.5 cm of capsular abutment, which can increase risk of occult transcapsular spread.  There was also nonspecific sclerosis in the left lower pubic body, likely degenerative and red marrow redistribution, felt to be nonspecific. The patient proceeded to transrectal ultrasound with 12 biopsies of the prostate on 12/11/21 and no specific lesion was noted on [place] to correlate with the MRI lesion.  The prostate volume measured 58.78 cc.  Out of 12 core biopsies, 5 were positive, all on the left.  The maximum Gleason score was 4+5, and this was seen in the base. Additionally, Gleason 4+4 was seen in the left base lateral, left apex lateral, left mid, and left  apex.  A PSMA scan was performed on 01/07/22 for disease staging and this showed focal activity in the left lobe of the prostate gland, correlating with MRI but without evidence of metastatic adenopathy, or visceral or skeletal metastasis.  The patient reviewed the biopsy results with his urologist and he has kindly been referred today for discussion of potential radiation treatment options. He was started on Orgovyx ADT 01/20/22 and is tolerating this well.  PREVIOUS RADIATION THERAPY: No  PAST MEDICAL HISTORY:  Past Medical History:  Diagnosis Date   Atrial flutter ([place]) 08/28/2017   Autoimmune hepatitis ([place]) 08/28/2017      PAST SURGICAL HISTORY: Past Surgical History:  Procedure Laterality Date   HYDROCELE EXCISION  1973    FAMILY HISTORY:  Family History  Problem Relation Age of Onset   Parkinson's disease Mother    Heart disease Father    Prostate cancer Father    Alzheimer's disease Father    Breast cancer Sister    Ulcerative colitis Sister    Liver disease Sister    Hypertension Brother     SOCIAL HISTORY: He enjoys running half and full marathons and is currently training for his next marathon.  Social History   Socioeconomic History   Marital status: Single    Spouse name: Not on file   Number of children: Not on file   Years of education: Not on file   Highest education level: Not on file  Occupational History   Occupation: Massage  Therapist  Tobacco Use   Smoking status: Never   Smokeless tobacco: Never  Substance and Sexual Activity   Alcohol use: Yes    Alcohol/week: 0.0 standard drinks    Comment: occ   Drug use: No   Sexual activity: Not on file  Other Topics Concern   Not on file  Social History Narrative   Not on file   Social Determinants of Health   Financial Resource Strain: Not on file  Food Insecurity: Not on file  Transportation Needs: Not on file  Physical Activity: Not on file  Stress: Not on file  Social Connections: Not on  file  Intimate Partner Violence: Not on file    ALLERGIES: Azathioprine  MEDICATIONS:  Current Outpatient Medications  Medication Sig Dispense Refill   mycophenolate (CELLCEPT) 500 MG tablet Take 1,000 mg by mouth 2 (two) times daily.     acetaminophen (TYLENOL) 325 MG tablet Take 650 mg every 12 (twelve) hours as needed by mouth. (Patient not taking: Reported on 01/28/2022)     ciprofloxacin (CIPRO) 500 MG tablet Take 500 mg 2 (two) times daily by mouth. (Patient not taking: Reported on 01/28/2022)     metoprolol tartrate (LOPRESSOR) 25 MG tablet TAKE 1/2 TABLET BY MOUTH TWICE A DAY. PT OVERDUE FOR OV PLEASE CALL FOR APPT 30 tablet 0   Multiple Vitamin (MULTIVITAMIN) tablet Take 1 tablet daily by mouth.     predniSONE (DELTASONE) 5 MG tablet Take 5 mg daily by mouth.  1   tamsulosin (FLOMAX) 0.4 MG CAPS capsule Take 0.4 mg daily by mouth. (Patient not taking: Reported on 01/28/2022)     No current facility-administered medications for this encounter.    REVIEW OF SYSTEMS:  On review of systems, the patient reports that he is doing well overall. He denies any chest pain, shortness of breath, cough, fevers, chills, night sweats, unintended weight changes. He denies any bowel disturbances, and denies abdominal pain, nausea or vomiting. He denies any new musculoskeletal or joint aches or pains. His IPSS was 14, indicating moderate urinary symptoms. His SHIM was 9, indicating he has moderate erectile dysfunction. A complete review of systems is obtained and is otherwise negative.    PHYSICAL EXAM:  Wt Readings from Last 3 Encounters:  01/28/22 165 lb (74.8 kg)  11/13/17 156 lb (70.8 kg)  08/26/17 157 lb (71.2 kg)   Temp Readings from Last 3 Encounters:  01/28/22 97.8 F (36.6 C)  09/09/15 97.5 F (36.4 C) (Oral)   BP Readings from Last 3 Encounters:  01/28/22 125/84  11/13/17 (!) 90/58  08/26/17 125/78   Pulse Readings from Last 3 Encounters:  01/28/22 60  11/13/17 70  08/26/17 73    Pain Assessment Pain Score: 0-No pain/10  In general this is a well appearing Caucasian male in no acute distress. He's alert and oriented x4 and appropriate throughout the examination. Cardiopulmonary assessment is negative for acute distress, and he exhibits normal effort.     KPS = 100  100 - Normal; no complaints; no evidence of disease. 90   - Able to carry on normal activity; minor signs or symptoms of disease. 80   - Normal activity with effort; some signs or symptoms of disease. 24   - Cares for self; unable to carry on normal activity or to do active work. 60   - Requires occasional assistance, but is able to care for most of his personal needs. 50   - Requires considerable assistance and frequent medical care.  62   - Disabled; requires special care and assistance. 53   - Severely disabled; hospital admission is indicated although death not imminent. 49   - Very sick; hospital admission necessary; active supportive treatment necessary. 10   - Moribund; fatal processes progressing rapidly. 0     - Dead  Karnofsky DA, Abelmann [place], Craver LS and Burchenal JH [phone]) The use of the nitrogen mustards in the palliative treatment of carcinoma: with particular reference to bronchogenic carcinoma Cancer 1 634-56  LABORATORY DATA:  Lab Results  Component Value Date   WBC 11.2 (H) 09/09/2015   HGB 14.9 09/09/2015   HCT 43.4 09/09/2015   MCV 91.2 09/09/2015   PLT 301 09/09/2015   Lab Results  Component Value Date   NA 139 09/09/2015   K 4.6 09/09/2015   CL 105 09/09/2015   CO2 24 09/09/2015   Lab Results  Component Value Date   ALT 27 09/09/2015   AST 29 09/09/2015   ALKPHOS 79 09/09/2015   BILITOT 0.8 09/09/2015     RADIOGRAPHY: NM PET (PSMA) SKULL TO MID THIGH  Result Date: 01/08/2022 CLINICAL DATA:  71 year old male with recent diagnosis of prostate carcinoma. EXAM: NUCLEAR MEDICINE PET SKULL BASE TO THIGH TECHNIQUE: 9.8 mCi F18 Piflufolastat (Pylarify) was injected  intravenously. Full-ring PET imaging was performed from the skull base to thigh after the radiotracer. CT data was obtained and used for attenuation correction and anatomic localization. COMPARISON:  Pelvic MRI 11/13/2021 FINDINGS: NECK No radiotracer activity in neck lymph nodes. Incidental CT finding: None CHEST No radiotracer accumulation within mediastinal or hilar lymph nodes. No suspicious pulmonary nodules on the CT scan. Incidental CT finding: None ABDOMEN/PELVIS Prostate: Focal radiotracer activity within the posterior LEFT prostate gland with SUV max equal 5.2 (image 41). Activity corresponds to a suspicious lesion on comparison MRI. Lymph nodes: No enlarged or radiotracer avid iliac lymph nodes are present. Physiologic activity noted in the ureters. No radiotracer avid periaortic retroperitoneal lymph nodes present. Liver: No abnormal radiotracer activity in the liver. Benign cyst in the RIGHT hepatic lobe. Incidental CT finding: Benign renal cysts. Midline umbilical hernia. SKELETON No focal activity to suggest skeletal metastasis. No suspicious blastic or lytic lesions. IMPRESSION: 1. Focal activity in the LEFT lobe of the prostate gland consistent with prostate adenocarcinoma. Findings correlate with suspicious MRI imaging. 2. No evidence of metastatic pelvic adenopathy or periaortic retroperitoneal adenopathy. 3. No distant visceral metastasis or skeletal metastasis. Electronically Signed   By: Suzy Bouchard M.D.   On: 01/08/2022 09:56      IMPRESSION/PLAN: 1. 71 y.o. gentleman with Stage T3b adenocarcinoma of the prostate with Gleason Score of 4+5, and PSA of 9.84. We discussed the patient's workup and outlined the nature of prostate cancer in this setting. The patient's T stage, Gleason's score, and PSA put him into the high risk group. Accordingly, he is eligible for a variety of potential treatment options including prostatectomy or LT-ADT in combination with either 8 weeks of external  radiation or 5 weeks of external radiation with an upfront brachytherapy boost. We discussed the available radiation techniques, and focused on the details and logistics of delivery. The patient may not be an ideal candidate for brachytherapy boost with his persistent LUTS despite Flomax daily We discussed and outlined the risks, benefits, short and long-term effects associated with radiotherapy and compared and contrasted these with prostatectomy. We discussed the role of SpaceOAR gel in reducing the rectal toxicity associated with radiotherapy. We also detailed the role  of ADT in the treatment of high risk prostate cancer and outlined the associated side effects that could be expected with this therapy. He was encouraged to ask questions that were answered to his stated satisfaction.  At the conclusion of our conversation, the patient is interested in moving forward with 8 weeks of external beam therapy in combination with ADT. He has started Orgovyx oral ADT as of 01/20/22 so we will share our discussion with Dr. Junious Silk and make arrangements for fiducial markers and SpaceOAR gel placement in April 2023, prior to simulation, to reduce rectal toxicity from radiotherapy. The patient appears to have a good understanding of his disease and our treatment recommendations which are of curative intent and is in agreement with the stated plan.  Therefore, we will move forward with treatment planning accordingly, in anticipation of beginning IMRT approximately 2 months after starting ADT.   We personally spent 70 minutes in this encounter including chart review, reviewing radiological studies, meeting face-to-face with the patient, entering orders and completing documentation.    Nicholos Johns, PA-C    Tyler Pita, MD  [facility] Direct Dial: [phone]   Fax: [phone] [email or web address]   Skype   [email or web address]   This document serves as a record of services personally performed  by Tyler Pita, MD and Freeman Caldron, PA-C. It was created on their behalf by Wilburn Mylar, a trained medical scribe. The creation of this record is based on the scribe's personal observations and the provider's statements to them. This document has been checked and approved by the attending provider.

## 2022-02-19 ENCOUNTER — Other Ambulatory Visit: Payer: Self-pay | Admitting: Urology

## 2022-02-19 ENCOUNTER — Telehealth: Payer: Self-pay | Admitting: *Deleted

## 2022-02-19 NOTE — Telephone Encounter (Signed)
CALLED PATIENT TO INFORM OF FID. MARKER AND SPACE OAR PLACEMENT ON 04-25-22 AND HIS SIM ON 04-28-22- ARRIVAL TIME- 9:45 AM @ [facility], LVM FOR A RETURN CALL

## 2022-02-21 NOTE — Progress Notes (Signed)
Patient chart reviewed pt meets [facility] guidelines for 04-25-2022 surgery barring any acute status changes

## 2022-04-23 ENCOUNTER — Other Ambulatory Visit: Payer: Self-pay

## 2022-04-23 ENCOUNTER — Encounter (HOSPITAL_BASED_OUTPATIENT_CLINIC_OR_DEPARTMENT_OTHER): Payer: Self-pay | Admitting: Urology

## 2022-04-23 DIAGNOSIS — R233 Spontaneous ecchymoses: Secondary | ICD-10-CM

## 2022-04-23 HISTORY — DX: Spontaneous ecchymoses: R23.3

## 2022-04-23 NOTE — Progress Notes (Addendum)
Spoke w/ via phone for pre-op interview---pt ?Lab needs dos----  I stat, ekg             ?Lab results------see below ?COVID test -----patient states asymptomatic no test needed ?Arrive at -------545 am 04-25-2022 ?NPO after MN NO Solid Food.  Clear liquids from MN until--- ?Med rec completed ?Medications to take morning of surgery -----cellcept ?Diabetic medication -----n/a ?Patient instructed no nail polish to be worn day of surgery ?Patient instructed to bring photo id and insurance card day of surgery ?Patient aware to have Driver (ride ) / caregiver housemate tosca      for 24 hours after surgery  ?Patient Special Instructions -----fleets enema am  of surgery ?Pre-Op special Istructions -----none ?Patient verbalized understanding of instructions that were given at this phone interview. ?Patient denies shortness of breath, chest pain, fever, cough at this phone interview.  ? 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 ?Addendum: patient chart reviewed with dr ryan ellender mda, pt meets [facility] guidelines per dr ryan ellender mda for 04-25-2022 surgery at [facility]. ?

## 2022-04-25 ENCOUNTER — Encounter (HOSPITAL_BASED_OUTPATIENT_CLINIC_OR_DEPARTMENT_OTHER)
Admission: RE | Disposition: A | Discharge status: Home / Self Care | Payer: Self-pay | Source: Home / Self Care | Attending: Urology

## 2022-04-25 ENCOUNTER — Ambulatory Visit (HOSPITAL_BASED_OUTPATIENT_CLINIC_OR_DEPARTMENT_OTHER): Payer: Medicare Other | Admitting: Anesthesiology

## 2022-04-25 ENCOUNTER — Telehealth: Payer: Self-pay | Admitting: *Deleted

## 2022-04-25 ENCOUNTER — Encounter (HOSPITAL_BASED_OUTPATIENT_CLINIC_OR_DEPARTMENT_OTHER): Payer: Self-pay | Admitting: Urology

## 2022-04-25 ENCOUNTER — Ambulatory Visit (HOSPITAL_BASED_OUTPATIENT_CLINIC_OR_DEPARTMENT_OTHER)
Admission: RE | Admit: 2022-04-25 | Discharge: 2022-04-25 | Disposition: A | Discharge status: Home / Self Care | Payer: Medicare Other | Attending: Urology | Admitting: Urology

## 2022-04-25 DIAGNOSIS — K7460 Unspecified cirrhosis of liver: Secondary | ICD-10-CM | POA: Diagnosis not present

## 2022-04-25 DIAGNOSIS — N289 Disorder of kidney and ureter, unspecified: Secondary | ICD-10-CM | POA: Diagnosis not present

## 2022-04-25 DIAGNOSIS — C61 Malignant neoplasm of prostate: Secondary | ICD-10-CM

## 2022-04-25 DIAGNOSIS — G709 Myoneural disorder, unspecified: Secondary | ICD-10-CM

## 2022-04-25 DIAGNOSIS — Z79899 Other long term (current) drug therapy: Secondary | ICD-10-CM | POA: Diagnosis not present

## 2022-04-25 HISTORY — PX: SPACE OAR INSTILLATION: SHX6769

## 2022-04-25 HISTORY — DX: Malignant neoplasm of prostate: C61

## 2022-04-25 HISTORY — PX: GOLD SEED IMPLANT: SHX6343

## 2022-04-25 HISTORY — DX: Meningitis, unspecified: G03.9

## 2022-04-25 LAB — POCT I-STAT, CHEM 8
BUN: 37 mg/dL — ABNORMAL HIGH (ref 8–23)
Calcium, Ion: 1.24 mmol/L (ref 1.15–1.40)
Chloride: 106 mmol/L (ref 98–111)
Creatinine, Ser: 1.1 mg/dL (ref 0.61–1.24)
Glucose, Bld: 129 mg/dL — ABNORMAL HIGH (ref 70–99)
HCT: 41 % (ref 39.0–52.0)
Hemoglobin: 13.9 g/dL (ref 13.0–17.0)
Potassium: 4.6 mmol/L (ref 3.5–5.1)
Sodium: 141 mmol/L (ref 135–145)
TCO2: 25 mmol/L (ref 22–32)

## 2022-04-25 SURGERY — INSERTION, GOLD SEEDS
Anesthesia: Monitor Anesthesia Care | Site: Prostate

## 2022-04-25 MED ORDER — ACETAMINOPHEN 500 MG PO TABS
1000.0000 mg | ORAL_TABLET | Freq: Once | ORAL | Status: AC
Start: 2022-04-25 — End: 2022-04-25
  Administered 2022-04-25: 1000 mg via ORAL

## 2022-04-25 MED ORDER — FENTANYL CITRATE (PF) 100 MCG/2ML IJ SOLN: 25.0000 ug | INTRAMUSCULAR | Status: DC | PRN

## 2022-04-25 MED ORDER — EPHEDRINE SULFATE-NACL 50-0.9 MG/10ML-% IV SOSY
PREFILLED_SYRINGE | INTRAVENOUS | Status: DC | PRN
  Administered 2022-04-25: 10 mg via INTRAVENOUS

## 2022-04-25 MED ORDER — LACTATED RINGERS IV SOLN: INTRAVENOUS | Status: DC

## 2022-04-25 MED ORDER — ACETAMINOPHEN 500 MG PO TABS
ORAL_TABLET | ORAL | Status: AC
  Filled 2022-04-25: qty 2

## 2022-04-25 MED ORDER — PROPOFOL 500 MG/50ML IV EMUL
INTRAVENOUS | Status: DC | PRN
  Administered 2022-04-25: 150 ug/kg/min via INTRAVENOUS

## 2022-04-25 MED ORDER — FLEET ENEMA 7-19 GM/118ML RE ENEM: 1.0000 | ENEMA | Freq: Once | RECTAL | Status: DC

## 2022-04-25 MED ORDER — LIDOCAINE HCL 1 % IJ SOLN
INTRAMUSCULAR | Status: DC | PRN
  Administered 2022-04-25: 8 mL

## 2022-04-25 MED ORDER — FENTANYL CITRATE (PF) 100 MCG/2ML IJ SOLN
INTRAMUSCULAR | Status: DC | PRN
  Administered 2022-04-25: 25 ug via INTRAVENOUS

## 2022-04-25 MED ORDER — AMISULPRIDE (ANTIEMETIC) 5 MG/2ML IV SOLN: 10.0000 mg | Freq: Once | INTRAVENOUS | Status: DC | PRN

## 2022-04-25 MED ORDER — MIDAZOLAM HCL 2 MG/2ML IJ SOLN
INTRAMUSCULAR | Status: AC
  Filled 2022-04-25: qty 2

## 2022-04-25 MED ORDER — SODIUM CHLORIDE (PF) 0.9 % IJ SOLN
INTRAMUSCULAR | Status: DC | PRN
  Administered 2022-04-25: 10 mL via INTRAVENOUS

## 2022-04-25 MED ORDER — PROPOFOL 1000 MG/100ML IV EMUL
INTRAVENOUS | Status: AC
  Filled 2022-04-25: qty 100

## 2022-04-25 MED ORDER — ONDANSETRON HCL 4 MG/2ML IJ SOLN
INTRAMUSCULAR | Status: DC | PRN
  Administered 2022-04-25: 4 mg via INTRAVENOUS

## 2022-04-25 MED ORDER — MIDAZOLAM HCL 5 MG/5ML IJ SOLN
INTRAMUSCULAR | Status: DC | PRN
  Administered 2022-04-25: 2 mg via INTRAVENOUS

## 2022-04-25 MED ORDER — PROPOFOL 500 MG/50ML IV EMUL
INTRAVENOUS | Status: AC
  Filled 2022-04-25: qty 50

## 2022-04-25 MED ORDER — CEFAZOLIN SODIUM-DEXTROSE 2-4 GM/100ML-% IV SOLN
INTRAVENOUS | Status: AC
  Filled 2022-04-25: qty 100

## 2022-04-25 MED ORDER — ONDANSETRON HCL 4 MG/2ML IJ SOLN: 4.0000 mg | Freq: Once | INTRAMUSCULAR | Status: DC | PRN

## 2022-04-25 MED ORDER — PROPOFOL 10 MG/ML IV BOLUS
INTRAVENOUS | Status: DC | PRN
  Administered 2022-04-25: 20 mg via INTRAVENOUS

## 2022-04-25 MED ORDER — CEFAZOLIN SODIUM-DEXTROSE 2-4 GM/100ML-% IV SOLN
2.0000 g | Freq: Once | INTRAVENOUS | Status: AC
  Administered 2022-04-25: 2 g via INTRAVENOUS

## 2022-04-25 MED ORDER — FENTANYL CITRATE (PF) 100 MCG/2ML IJ SOLN
INTRAMUSCULAR | Status: AC
Start: 2022-04-25 — End: ?
  Filled 2022-04-25: qty 2

## 2022-04-25 SURGICAL SUPPLY — 26 items
BLADE CLIPPER SENSICLIP SURGIC (BLADE) ×2 IMPLANT
CNTNR URN SCR LID CUP LEK RST (MISCELLANEOUS) ×1 IMPLANT
CONT SPEC 4OZ STRL OR WHT (MISCELLANEOUS) ×2
COVER BACK TABLE 60X90IN (DRAPES) ×2 IMPLANT
DRSG IV TEGADERM 3.5X4.5 STRL (GAUZE/BANDAGES/DRESSINGS) ×1 IMPLANT
DRSG TEGADERM 4X4.75 (GAUZE/BANDAGES/DRESSINGS) ×2 IMPLANT
DRSG TEGADERM 8X12 (GAUZE/BANDAGES/DRESSINGS) ×2 IMPLANT
GAUZE SPONGE 4X4 12PLY STRL (GAUZE/BANDAGES/DRESSINGS) ×2 IMPLANT
GAUZE SPONGE 4X4 8PLY NS (GAUZE/BANDAGES/DRESSINGS) ×1 IMPLANT
GLOVE BIO SURGEON STRL SZ7.5 (GLOVE) ×2 IMPLANT
GLOVE SURG ORTHO 8.5 STRL (GLOVE) ×2 IMPLANT
IMPL SPACEOAR VUE SYSTEM (Spacer) ×1 IMPLANT
IMPLANT SPACEOAR VUE SYSTEM (Spacer) ×2 IMPLANT
KIT TURNOVER CYSTO (KITS) ×2 IMPLANT
MARKER GOLD PRELOAD 1.2X3 (Urological Implant) ×1 IMPLANT
MARKER SKIN DUAL TIP RULER LAB (MISCELLANEOUS) ×2 IMPLANT
NDL SPNL 22GX3.5 QUINCKE BK (NEEDLE) ×1 IMPLANT
NEEDLE SPNL 22GX3.5 QUINCKE BK (NEEDLE) ×2 IMPLANT
SEED GOLD PRELOAD 1.2X3 (Urological Implant) ×2 IMPLANT
SHEATH ULTRASOUND LF (SHEATH) IMPLANT
SHEATH ULTRASOUND LTX NONSTRL (SHEATH) ×1 IMPLANT
SURGILUBE 2OZ TUBE FLIPTOP (MISCELLANEOUS) ×2 IMPLANT
SYR 10ML LL (SYRINGE) ×2 IMPLANT
SYR CONTROL 10ML LL (SYRINGE) ×2 IMPLANT
TOWEL OR 17X26 10 PK STRL BLUE (TOWEL DISPOSABLE) ×2 IMPLANT
UNDERPAD 30X36 HEAVY ABSORB (UNDERPADS AND DIAPERS) ×2 IMPLANT

## 2022-04-25 NOTE — Op Note (Signed)
Preoperative diagnosis: Clinically localized adenocarcinoma of the prostate  ? ?Postoperative diagnosis: Clinically localized adenocarcinoma of the prostate ? ?Procedure: 1) Placement of fiducial markers into prostate ?                   2) Insertion of SpaceOAR hydrogel  ? ?Surgeon: Louis Meckel, M.D. ? ?Anesthesia: General ? ?EBL: Minimal ? ?Complications: None ? ?Indication: Blake [name] is a 71 y.o. gentleman with clinically localized prostate cancer. After discussing management options for treatment, he elected to proceed with radiotherapy. He presents today for the above procedures. The potential risks, complications, alternative options, and expected recovery course have been discussed in detail with the patient and he has provided informed consent to proceed. ? ?Description of procedure: The patient was administered preoperative antibiotics, placed in the dorsal lithotomy position, and prepped and draped in the usual sterile fashion. Next, transrectal ultrasonography was utilized to visualize the prostate. Three gold fiducial markers were then placed into the prostate via transperineal needles under ultrasound guidance at the right apex, right base, and left mid gland under direct ultrasound guidance. A site in the midline was then selected on the perineum for placement of an 18 g needle with saline. The needle was advanced above the rectum and below Denonvillier's fascia to the mid gland and confirmed to be in the midline on transverse imaging. One cc of saline was injected confirming appropriate expansion of this space. A total of 5 cc of saline was then injected to open the space further bilaterally. The saline syringe was then removed and the SpaceOAR hydrogel was injected with good distribution bilaterally. He tolerated the procedure well and without complications. He was given a voiding trial prior to discharge from the PACU.  ?

## 2022-04-25 NOTE — Discharge Instructions (Addendum)
For several days the patient: ? ?should increase his fluid intake and limit strenuous activity. ?he might have mild discomfort at the base of his penis or in his rectum. ?he might have blood in his urine or blood in his bowel movements. ? ?For 2-3 months he might have blood in his ejaculate (semen). ? ?Call the office immedicately: ? for blood clots in the urine or bowel movements,  ?difficulty urinating,  ?inability to urinate,  ?urinary retention,  ?painful or frequent urination,  ?fever, chills,  ?nausea, vomiting, ?other illness.  ? ? ?[facility]:  [phone]  ? ? ? ? ?Post Anesthesia Home Care Instructions ? ?Activity: ?Get plenty of rest for the remainder of the day. A responsible individual must stay with you for 24 hours following the procedure.  ?For the next 24 hours, DO NOT: ?-Drive a car ?-Paediatric nurse ?-Drink alcoholic beverages ?-Take any medication unless instructed by your physician ?-Make any legal decisions or sign important papers. ? ?Meals: ?Start with liquid foods such as gelatin or soup. Progress to regular foods as tolerated. Avoid greasy, spicy, heavy foods. If nausea and/or vomiting occur, drink only clear liquids until the nausea and/or vomiting subsides. Call your physician if vomiting continues. ? ?Special Instructions/Symptoms: ?Your throat may feel dry or sore from the anesthesia or the breathing tube placed in your throat during surgery. If this causes discomfort, gargle with warm salt water. The discomfort should disappear within 24 hours. ? ?If you had a scopolamine patch placed behind your ear for the management of post- operative nausea and/or vomiting: ? ?1. The medication in the patch is effective for 72 hours, after which it should be removed.  Wrap patch in a tissue and discard in the trash. Wash hands thoroughly with soap and water. ?2. You may remove the patch earlier than 72 hours if you experience unpleasant side effects which may include dry mouth, dizziness  or visual disturbances. ?3. Avoid touching the patch. Wash your hands with soap and water after contact with the patch. ?    ?

## 2022-04-25 NOTE — Transfer of Care (Signed)
Immediate Anesthesia Transfer of Care Note ? ?Patient: Blake [name] ? ?Procedure(s) Performed: GOLD SEED IMPLANT (Prostate) ?SPACE OAR INSTILLATION (Prostate) ? ?Patient Location: PACU ? ?Anesthesia Type:MAC ? ?Level of Consciousness: awake, alert  and oriented ? ?Airway & Oxygen Therapy: Patient Spontanous Breathing ? ?Post-op Assessment: Report given to RN and Post -op Vital signs reviewed and stable ? ?Post vital signs: Reviewed and stable ? ?Last Vitals:  ?Vitals Value Taken Time  ?BP 110/62 04/25/22 0800  ?Temp    ?Pulse 62 04/25/22 0801  ?Resp 18 04/25/22 0801  ?SpO2 93 % 04/25/22 0801  ?Vitals shown include unvalidated device data. ? ?Last Pain:  ?Vitals:  ? 04/25/22 0602  ?TempSrc: Oral  ?PainSc: 0-No pain  ?   ? ?Patients Stated Pain Goal: 5 (04/25/22 0602) ? ?Complications: No notable events documented. ?

## 2022-04-25 NOTE — H&P (Signed)
/u -  ? ?1) Prostate cancer - pt dx with HR PCa Dec 2022. His PSA was rising to 6.8 in 2013 then 05/13 4.11. PSA rose to 05/22 8.35. His Oct 2022 PSA rose to 9.84 and a 11/22 pMRI showed a 48 g prostate and a 1.8 cm PIRADS 4 left PZ mid to left SV lesion with 1.5 cm of capsular abutment but no ECE and poss left SV involvement with negative NVB or LAD. Prostate bx confirmed Gleason 4+5 and 4+4 on the left side of the prostate.  ? ?Biopsy:  ?December 2022 high risk prostate cancer  ?PSA 9.8  ?T1c/T3b (MRI-possible left SV involvement)  ?Prostate 59 g  ?Gleason 4+5 = 9, 1 core, 20% left  ?Gleason 4+4 = 8, 4 cores, 5 to 30%, left  ?5/12 all left  ? ? ?Staging: 11/22 pMRI showed a 48 g prostate and a 1.8 cm PIRADS 4 left PZ mid to left SV lesion with 1.5 cm of capsular abutment but no ECE and poss left SV involvement with negative NVB or LAD. Nonspecific sclerosis of the left lower pubic body and inferior pubic ramus.  ? ? ?2) BPH - prostate 48 g on MRI and 59 g on [place]. AUASS = 13. Some hesitancy/discomfort. Noc x 3-4. No snoring, but prednisone messes up his sleep cycle. He might snore.  ? 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 He had meningitis. He is on mycophenolate and PRN prednisone.  ? ?Today, seen for the above.  ? 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  ? ?Today, seen for prostate biopsy and we double covered with FQ and rocephin.  ? ?  ?ALLERGIES: No Allergies ?  ? ?MEDICATIONS: Cipro  ?Levofloxacin 750 mg tablet Take 1 tablet PO the morning of your procedure  ?Mycophenolate Mofetil  ?  ? ?GU PSH: Prostate Needle Biopsy - 12/11/2021 ?Remove Hydrocele - 2014 ? ?  ?   ?PSH Notes: Surgery Spermatic Cord Excision Of Hydrocele  ? ?NON-GU PSH: Surgical Pathology, Gross And Microscopic Examination For Prostate Needle - 12/11/2021 ? ?  ? ?GU PMH: Elevated PSA, exam benign - prostate ~ 58 g. No specific [place] lesion. - 12/11/2021, We discussed the nature, risks and benefits of PSA screening as  well as the nature of elevated PSA (benign versus malignant). We discussed the possibility of prostate cancer and that as the PSA rises above the level of 2.5 and even over 1, the risk of prostate cancer on biopsy increases. We discussed the management of prostate cancer might include active surveillance or treatment depending on patient and cancer characteristics. In that context, we discussed the nature, risks and benefits of continued surveillance, other lab tests, transrectal ultrasound/prostate biopsy, or prostate MRI. All questions answered. Benign DRE today. PSA was sent to determine follow-up. If remains elevated, I would get an MRI to possibly bx given his autoimmune disease. , - 10/04/2021, Elevated prostate specific antigen (PSA), - 2014 ?BPH w/LUTS, Disc he should speak with Dr. Darron Doom about a sleep study. He reports snoring. - 10/04/2021 ?Nocturia - 10/04/2021 ?  ?   ?PMH Notes:  ?2013-05-20 15:02:47 - Note: Arthritis  ? ?NON-GU PMH: Personal history of other endocrine, nutritional and metabolic disease, History of hypercholesterolemia - 2014 ?Encounter for general adult medical examination without abnormal findings, Encounter for preventive health examination ?Liver Disease ?  ? ?FAMILY HISTORY: Death of family member - Mother, Father ?liver disease - Sister ?Parkinson's Disease - Mother ?Prostate Cancer - Father  ? ?  SOCIAL HISTORY: Marital Status: Single ?Preferred Language: [place]; Ethnicity: Not Hispanic Or Latino; Race: White ?Current Smoking Status: Patient has never smoked.  ? ?Tobacco Use Assessment Completed: Used Tobacco in last 30 days? ?Does not use smokeless tobacco. ?Drinks 1 drink per month.  ?Drinks 3 caffeinated drinks per day. ?Patient's occupation is/was retired. ?  ?  Notes: Never A Smoker, Caffeine Use, Marital History - Single, Tobacco Use, Being A Social Drinker, Occupation:  ? ?REVIEW OF SYSTEMS:    ?GU Review Male:   Patient denies frequent urination, hard to postpone urination,  burning/ pain with urination, get up at night to urinate, leakage of urine, stream starts and stops, trouble starting your stream, have to strain to urinate , erection problems, and penile pain.  ?Gastrointestinal (Upper):   Patient denies nausea, vomiting, and indigestion/ heartburn.  ?Gastrointestinal (Lower):   Patient denies diarrhea and constipation.  ?Constitutional:   Patient denies fever, night sweats, weight loss, and fatigue.  ?Skin:   Patient denies skin rash/ lesion and itching.  ?Eyes:   Patient denies blurred vision and double vision.  ?Ears/ Nose/ Throat:   Patient denies sore throat and sinus problems.  ?Hematologic/Lymphatic:   Patient denies swollen glands and easy bruising.  ?Cardiovascular:   Patient denies leg swelling and chest pains.  ?Respiratory:   Patient denies cough and shortness of breath.  ?Endocrine:   Patient denies excessive thirst.  ?Musculoskeletal:   Patient denies back pain and joint pain.  ?Neurological:   Patient denies headaches and dizziness.  ?Psychologic:   Patient denies depression and anxiety.  ? ?VITAL SIGNS: None  ? ?MULTI-SYSTEM PHYSICAL EXAMINATION:    ?Constitutional: Well-nourished. No physical deformities. Normally developed. Good grooming.  ?Neck: Neck symmetrical, not swollen. Normal tracheal position.  ?Respiratory: No labored breathing, no use of accessory muscles.   ?Cardiovascular: Normal temperature, normal extremity pulses, no swelling, no varicosities.  ?Skin: No paleness, no jaundice, no cyanosis. No lesion, no ulcer, no rash.  ?Neurologic / Psychiatric: Oriented to time, oriented to place, oriented to person. No depression, no anxiety, no agitation.  ?Gastrointestinal: No mass, no tenderness, no rigidity, non obese abdomen.  ? ?  ?Complexity of Data:  ?Lab Test Review:   Path Report  ?X-Ray Review: MRI Prostate GSORAD: Reviewed Report. Discussed With Patient. 2022 ?  ? 10/04/21 05/21/13  ?PSA  ?Total PSA 9.84 ng/mL 4.11   ?Free PSA 1.82 ng/mL 0.80   ?%  Free PSA 18 % PSA 19   ? ? ?PROCEDURES:    ? ?     Urinalysis w/Scope ?Dipstick Dipstick Cont'd Micro  ?Color: Amber Bilirubin: Neg mg/dL WBC/hpf: 6 - 10/hpf  ?Appearance: Cloudy Ketones: Neg mg/dL RBC/hpf: >60/hpf  ?Specific Gravity: 1.025 Blood: 3+ ery/uL Bacteria: Rare (0-9/hpf)  ?pH: 5.5 Protein: Trace mg/dL Cystals: NS (Not Seen)  ?Glucose: Neg mg/dL Urobilinogen: 0.2 mg/dL Casts: NS (Not Seen)  ?  Nitrites: Neg Trichomonas: Not Present  ?  Leukocyte Esterase: 1+ leu/uL Mucous: Not Present  ?    Epithelial Cells: 0 - 5/hpf  ?    Yeast: NS (Not Seen)  ?    Sperm: Not Present  ? ? ?ASSESSMENT:  ?    ICD-10 Details  ?1 GU:   Prostate Cancer - C61 Chronic, Stable - I had a long discussion with the patient using his path report as a reference. We discussed his stage, grade and prognosis. We discussed the nature risks and benefits of active surveillance, radical prostatectomy, external beam radiotherapy, and brachytherapy.  We discussed the role of androgen deprivation and chemotherapy in prostate cancer. We also discussed other ablative techniques such as HiFU and cryotherapy as well as whole gland versus focal treatment. We discussed specifically how each treatment might affect bowel, bladder and sexual function. We discussed how each treatment might effect salvage treatments and active surveillance might lead to progression and more difficult treatment in the future. All questions answered. Will set up CT/PET scan to asses for metastatic disease and the bone lesion. Refer to [facility]. Also discussed gold seeds, spaceoar nature r/b. Continue to exercise and check with GI doc about Ca / vit D (liver affect). ALso disc secondary AR/abi and chemo role in PCa if mets.  ? ?1. Check PET  ?2.Rx Orgovyx or Eligard  ?3.Referred to Dr. Tammi Klippel   ? ?PLAN:    ? ?      Orders ?X-Rays: PET Scan - Pylarify PET scan PSMA - High risk PCa staging   ? ? ?      Schedule ?Return Visit/Planned Activity: Return PRN - Office Visit,  Follow up MD  ? ?

## 2022-04-25 NOTE — Telephone Encounter (Signed)
CALLED PATIENT TO REMIND OF SIM APPT. FOR 04-28-22- ARRIVAL TIME- 9:45 AM @ [facility], PATIENT INFORMED TO ARRIVE WITH A FULL BLADDER AND AN EMPTY BOWEL ?

## 2022-04-25 NOTE — Interval H&P Note (Signed)
History and Physical Interval Note: ? ?04/25/2022 ?7:08 AM ? ?Blake [name]  has presented today for surgery, with the diagnosis of PROSTATE CANCER.  The various methods of treatment have been discussed with the patient and family. After consideration of risks, benefits and other options for treatment, the patient has consented to  Procedure(s): ?GOLD SEED IMPLANT (N/A) ?SPACE OAR INSTILLATION (N/A) as a surgical intervention.  The patient's history has been reviewed, patient examined, no change in status, stable for surgery.  I have reviewed the patient's chart and labs.  Questions were answered to the patient's satisfaction.   ? ? ?Ardis Hughs ? ? ?

## 2022-04-25 NOTE — Anesthesia Postprocedure Evaluation (Signed)
Anesthesia Post Note ? ?Patient: Blake [name] ? ?Procedure(s) Performed: GOLD SEED IMPLANT (Prostate) ?SPACE OAR INSTILLATION (Prostate) ? ?  ? ?Patient location during evaluation: PACU ?Anesthesia Type: MAC ?Level of consciousness: awake ?Pain management: pain level controlled ?Vital Signs Assessment: post-procedure vital signs reviewed and stable ?Respiratory status: spontaneous breathing, nonlabored ventilation, respiratory function stable and patient connected to nasal cannula oxygen ?Cardiovascular status: stable and blood pressure returned to baseline ?Postop Assessment: no apparent nausea or vomiting ?Anesthetic complications: no ? ? ?No notable events documented. ? ?Last Vitals:  ?Vitals:  ? 04/25/22 0830 04/25/22 0855  ?BP: 119/66 121/71  ?Pulse: (!) 56 (!) 54  ?Resp: 10 14  ?Temp:  36.6 ?C  ?SpO2: 97% 100%  ?  ?Last Pain:  ?Vitals:  ? 04/25/22 0855  ?TempSrc:   ?PainSc: 0-No pain  ? ? ?  ?  ?  ?  ?  ?  ? ?Levon Boettcher P Haeven Nickle ? ? ? ? ?

## 2022-04-25 NOTE — Anesthesia Preprocedure Evaluation (Addendum)
Anesthesia Evaluation  ?Patient identified by MRN, date of birth, ID band ?Patient awake ? ? ? ?Reviewed: ?Allergy & Precautions, NPO status , Patient's Chart, lab work & pertinent test results ? ?Airway ?Mallampati: II ? ?TM Distance: >3 FB ?Neck ROM: Full ? ? ? Dental ? ?(+) Missing ?  ?Pulmonary ?neg pulmonary ROS,  ?  ?Pulmonary exam normal ? ? ? ? ? ? ? Cardiovascular ?Normal cardiovascular exam+ dysrhythmias (h/o) Atrial Fibrillation  ? ? ?  ?Neuro/Psych ? Neuromuscular disease negative psych ROS  ? GI/Hepatic ?negative GI ROS, (+) Cirrhosis  ?  ?  ? , Hepatitis - (autoimmune)  ?Endo/Other  ?negative endocrine ROS ? Renal/GU ?Renal disease  ? ?  ?Musculoskeletal ?negative musculoskeletal ROS ?(+)  ? Abdominal ?  ?Peds ? Hematology ?negative hematology ROS ?(+)   ?Anesthesia Other Findings ?PROSTATE CANCER ? Reproductive/Obstetrics ? ?  ? ? ? ? ? ? ? ? ? ? ? ? ? ?  ?  ? ? ? ? ? ? ? ?Anesthesia Physical ?Anesthesia Plan ? ?ASA: 3 ? ?Anesthesia Plan: MAC  ? ?Post-op Pain Management:   ? ?Induction: Intravenous ? ?PONV Risk Score and Plan: 1 and Ondansetron, Dexamethasone, Propofol infusion, Midazolam and Treatment may vary due to age or medical condition ? ?Airway Management Planned: Simple Face Mask ? ?Additional Equipment:  ? ?Intra-op Plan:  ? ?Post-operative Plan:  ? ?Informed Consent: I have reviewed the patients History and Physical, chart, labs and discussed the procedure including the risks, benefits and alternatives for the proposed anesthesia with the patient or authorized representative who has indicated his/her understanding and acceptance.  ? ? ? ?Dental advisory given ? ?Plan Discussed with: CRNA ? ?Anesthesia Plan Comments:   ? ? ? ? ? ?Anesthesia Quick Evaluation ? ?

## 2022-04-28 ENCOUNTER — Ambulatory Visit
Admission: RE | Admit: 2022-04-28 | Discharge: 2022-04-28 | Disposition: A | Discharge status: Home / Self Care | Payer: Medicare Other | Source: Ambulatory Visit | Attending: Radiation Oncology | Admitting: Radiation Oncology

## 2022-04-28 ENCOUNTER — Encounter (HOSPITAL_BASED_OUTPATIENT_CLINIC_OR_DEPARTMENT_OTHER): Payer: Self-pay | Admitting: Urology

## 2022-04-28 DIAGNOSIS — Z510 Encounter for antineoplastic radiation therapy: Secondary | ICD-10-CM | POA: Insufficient documentation

## 2022-04-28 DIAGNOSIS — C61 Malignant neoplasm of prostate: Secondary | ICD-10-CM | POA: Insufficient documentation

## 2022-04-29 NOTE — Progress Notes (Signed)
?  Radiation Oncology         [phone] ?________________________________ ? ?Name: Blake [name] MRN: [id number]  ?Date: 04/28/2022  DOB: 1951-06-16 ? ?SIMULATION AND TREATMENT PLANNING NOTE ? ?  ICD-10-CM   ?1. Malignant neoplasm of prostate ([place])  C61   ?  ? ? ?DIAGNOSIS:  71 y.o. 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 ? ?NARRATIVE:  The patient was brought to the [facility].  Identity was confirmed.  All relevant records and images related to the planned course of therapy were reviewed.  The patient freely provided informed written consent to proceed with treatment after reviewing the details related to the planned course of therapy. The consent form was witnessed and verified by the simulation staff.  Then, the patient was set-up in a stable reproducible supine position for radiation therapy.  A vacuum lock pillow device was custom fabricated to position his legs in a reproducible immobilized position.  Then, I performed a urethrogram under sterile conditions to identify the prostatic bed.  CT images were obtained.  Surface markings were placed.  The CT images were loaded into the planning software.  Then the prostate bed target, pelvic lymph node target and avoidance structures including the rectum, bladder, bowel and hips were contoured.  Treatment planning then occurred.  The radiation prescription was entered and confirmed.  A total of one complex treatment devices were fabricated. I have requested : Intensity Modulated Radiotherapy (IMRT) is medically necessary for this case for the following reason:  Rectal sparing.. ? ?PLAN:  The patient will receive 45 Gy in 25 fractions of 1.8 Gy, followed by a boost to the prostate to a total dose of 75 Gy with 15 additional fractions of 2 Gy. ? ? ?________________________________ ? ?Sheral Apley Tammi Klippel, M.D. ? ?

## 2022-05-06 DIAGNOSIS — Z510 Encounter for antineoplastic radiation therapy: Secondary | ICD-10-CM | POA: Diagnosis not present

## 2022-05-12 ENCOUNTER — Other Ambulatory Visit: Payer: Self-pay

## 2022-05-12 ENCOUNTER — Ambulatory Visit
Admission: RE | Admit: 2022-05-12 | Discharge: 2022-05-12 | Disposition: A | Discharge status: Home / Self Care | Payer: Medicare Other | Source: Ambulatory Visit | Attending: Radiation Oncology | Admitting: Radiation Oncology

## 2022-05-12 DIAGNOSIS — Z510 Encounter for antineoplastic radiation therapy: Secondary | ICD-10-CM | POA: Diagnosis not present

## 2022-05-12 LAB — RAD ONC ARIA SESSION SUMMARY
Course Elapsed Days: 0
Plan Fractions Treated to Date: 1
Plan Prescribed Dose Per Fraction: 1.8 Gy
Plan Total Fractions Prescribed: 25
Plan Total Prescribed Dose: 45 Gy
Reference Point Dosage Given to Date: 1.8 Gy
Reference Point Session Dosage Given: 1.8 Gy
Session Number: 1

## 2022-05-13 ENCOUNTER — Ambulatory Visit
Admission: RE | Admit: 2022-05-13 | Discharge: 2022-05-13 | Disposition: A | Discharge status: Home / Self Care | Payer: Medicare Other | Source: Ambulatory Visit | Attending: Radiation Oncology | Admitting: Radiation Oncology

## 2022-05-13 ENCOUNTER — Other Ambulatory Visit: Payer: Self-pay

## 2022-05-13 DIAGNOSIS — Z510 Encounter for antineoplastic radiation therapy: Secondary | ICD-10-CM | POA: Diagnosis not present

## 2022-05-13 LAB — RAD ONC ARIA SESSION SUMMARY
Course Elapsed Days: 1
Plan Fractions Treated to Date: 2
Plan Prescribed Dose Per Fraction: 1.8 Gy
Plan Total Fractions Prescribed: 25
Plan Total Prescribed Dose: 45 Gy
Reference Point Dosage Given to Date: 3.6 Gy
Reference Point Session Dosage Given: 1.8 Gy
Session Number: 2

## 2022-05-14 ENCOUNTER — Ambulatory Visit
Admission: RE | Admit: 2022-05-14 | Discharge: 2022-05-14 | Disposition: A | Discharge status: Home / Self Care | Payer: Medicare Other | Source: Ambulatory Visit | Attending: Radiation Oncology | Admitting: Radiation Oncology

## 2022-05-14 ENCOUNTER — Other Ambulatory Visit: Payer: Self-pay

## 2022-05-14 DIAGNOSIS — Z510 Encounter for antineoplastic radiation therapy: Secondary | ICD-10-CM | POA: Diagnosis not present

## 2022-05-14 LAB — RAD ONC ARIA SESSION SUMMARY
Course Elapsed Days: 2
Plan Fractions Treated to Date: 3
Plan Prescribed Dose Per Fraction: 1.8 Gy
Plan Total Fractions Prescribed: 25
Plan Total Prescribed Dose: 45 Gy
Reference Point Dosage Given to Date: 5.4 Gy
Reference Point Session Dosage Given: 1.8 Gy
Session Number: 3

## 2022-05-15 ENCOUNTER — Other Ambulatory Visit: Payer: Self-pay

## 2022-05-15 ENCOUNTER — Ambulatory Visit
Admission: RE | Admit: 2022-05-15 | Discharge: 2022-05-15 | Disposition: A | Discharge status: Home / Self Care | Payer: Medicare Other | Source: Ambulatory Visit | Attending: Radiation Oncology | Admitting: Radiation Oncology

## 2022-05-15 DIAGNOSIS — Z510 Encounter for antineoplastic radiation therapy: Secondary | ICD-10-CM | POA: Diagnosis not present

## 2022-05-15 LAB — RAD ONC ARIA SESSION SUMMARY
Course Elapsed Days: 3
Plan Fractions Treated to Date: 4
Plan Prescribed Dose Per Fraction: 1.8 Gy
Plan Total Fractions Prescribed: 25
Plan Total Prescribed Dose: 45 Gy
Reference Point Dosage Given to Date: 7.2 Gy
Reference Point Session Dosage Given: 1.8 Gy
Session Number: 4

## 2022-05-16 ENCOUNTER — Other Ambulatory Visit: Payer: Self-pay

## 2022-05-16 ENCOUNTER — Ambulatory Visit: Payer: Medicare Other

## 2022-05-16 ENCOUNTER — Ambulatory Visit
Admission: RE | Admit: 2022-05-16 | Discharge: 2022-05-16 | Disposition: A | Discharge status: Home / Self Care | Payer: Medicare Other | Source: Ambulatory Visit | Attending: Radiation Oncology | Admitting: Radiation Oncology

## 2022-05-16 DIAGNOSIS — Z510 Encounter for antineoplastic radiation therapy: Secondary | ICD-10-CM | POA: Diagnosis not present

## 2022-05-16 LAB — RAD ONC ARIA SESSION SUMMARY
Course Elapsed Days: 4
Plan Fractions Treated to Date: 5
Plan Prescribed Dose Per Fraction: 1.8 Gy
Plan Total Fractions Prescribed: 25
Plan Total Prescribed Dose: 45 Gy
Reference Point Dosage Given to Date: 9 Gy
Reference Point Session Dosage Given: 1.8 Gy
Session Number: 5

## 2022-05-19 ENCOUNTER — Other Ambulatory Visit: Payer: Self-pay

## 2022-05-19 ENCOUNTER — Ambulatory Visit
Admission: RE | Admit: 2022-05-19 | Discharge: 2022-05-19 | Disposition: A | Discharge status: Home / Self Care | Payer: Medicare Other | Source: Ambulatory Visit | Attending: Radiation Oncology | Admitting: Radiation Oncology

## 2022-05-19 DIAGNOSIS — Z510 Encounter for antineoplastic radiation therapy: Secondary | ICD-10-CM | POA: Diagnosis not present

## 2022-05-19 LAB — RAD ONC ARIA SESSION SUMMARY
Course Elapsed Days: 7
Plan Fractions Treated to Date: 6
Plan Prescribed Dose Per Fraction: 1.8 Gy
Plan Total Fractions Prescribed: 25
Plan Total Prescribed Dose: 45 Gy
Reference Point Dosage Given to Date: 10.8 Gy
Reference Point Session Dosage Given: 1.8 Gy
Session Number: 6

## 2022-05-20 ENCOUNTER — Ambulatory Visit
Admission: RE | Admit: 2022-05-20 | Discharge: 2022-05-20 | Disposition: A | Discharge status: Home / Self Care | Payer: Medicare Other | Source: Ambulatory Visit | Attending: Radiation Oncology | Admitting: Radiation Oncology

## 2022-05-20 ENCOUNTER — Other Ambulatory Visit: Payer: Self-pay

## 2022-05-20 DIAGNOSIS — Z510 Encounter for antineoplastic radiation therapy: Secondary | ICD-10-CM | POA: Diagnosis not present

## 2022-05-20 LAB — RAD ONC ARIA SESSION SUMMARY
Course Elapsed Days: 8
Plan Fractions Treated to Date: 7
Plan Prescribed Dose Per Fraction: 1.8 Gy
Plan Total Fractions Prescribed: 25
Plan Total Prescribed Dose: 45 Gy
Reference Point Dosage Given to Date: 12.6 Gy
Reference Point Session Dosage Given: 1.8 Gy
Session Number: 7

## 2022-05-21 ENCOUNTER — Other Ambulatory Visit: Payer: Self-pay

## 2022-05-21 ENCOUNTER — Ambulatory Visit
Admission: RE | Admit: 2022-05-21 | Discharge: 2022-05-21 | Disposition: A | Discharge status: Home / Self Care | Payer: Medicare Other | Source: Ambulatory Visit | Attending: Radiation Oncology | Admitting: Radiation Oncology

## 2022-05-21 DIAGNOSIS — Z510 Encounter for antineoplastic radiation therapy: Secondary | ICD-10-CM | POA: Diagnosis not present

## 2022-05-21 LAB — RAD ONC ARIA SESSION SUMMARY
Course Elapsed Days: 9
Plan Fractions Treated to Date: 8
Plan Prescribed Dose Per Fraction: 1.8 Gy
Plan Total Fractions Prescribed: 25
Plan Total Prescribed Dose: 45 Gy
Reference Point Dosage Given to Date: 14.4 Gy
Reference Point Session Dosage Given: 1.8 Gy
Session Number: 8

## 2022-05-22 ENCOUNTER — Ambulatory Visit
Admission: RE | Admit: 2022-05-22 | Discharge: 2022-05-22 | Disposition: A | Discharge status: Home / Self Care | Payer: Medicare Other | Source: Ambulatory Visit | Attending: Radiation Oncology | Admitting: Radiation Oncology

## 2022-05-22 ENCOUNTER — Other Ambulatory Visit: Payer: Self-pay

## 2022-05-22 DIAGNOSIS — Z510 Encounter for antineoplastic radiation therapy: Secondary | ICD-10-CM | POA: Diagnosis not present

## 2022-05-22 LAB — RAD ONC ARIA SESSION SUMMARY
Course Elapsed Days: 10
Plan Fractions Treated to Date: 9
Plan Prescribed Dose Per Fraction: 1.8 Gy
Plan Total Fractions Prescribed: 25
Plan Total Prescribed Dose: 45 Gy
Reference Point Dosage Given to Date: 16.2 Gy
Reference Point Session Dosage Given: 1.8 Gy
Session Number: 9

## 2022-05-23 ENCOUNTER — Other Ambulatory Visit: Payer: Self-pay

## 2022-05-23 ENCOUNTER — Other Ambulatory Visit: Payer: Self-pay | Admitting: Radiation Oncology

## 2022-05-23 ENCOUNTER — Ambulatory Visit
Admission: RE | Admit: 2022-05-23 | Discharge: 2022-05-23 | Disposition: A | Discharge status: Home / Self Care | Payer: Medicare Other | Source: Ambulatory Visit | Attending: Radiation Oncology | Admitting: Radiation Oncology

## 2022-05-23 DIAGNOSIS — Z510 Encounter for antineoplastic radiation therapy: Secondary | ICD-10-CM | POA: Diagnosis not present

## 2022-05-23 LAB — RAD ONC ARIA SESSION SUMMARY
Course Elapsed Days: 11
Plan Fractions Treated to Date: 10
Plan Prescribed Dose Per Fraction: 1.8 Gy
Plan Total Fractions Prescribed: 25
Plan Total Prescribed Dose: 45 Gy
Reference Point Dosage Given to Date: 18 Gy
Reference Point Session Dosage Given: 1.8 Gy
Session Number: 10

## 2022-05-23 MED ORDER — TAMSULOSIN HCL 0.4 MG PO CAPS: 0.4000 mg | ORAL_CAPSULE | Freq: Every day | ORAL | 5 refills | Status: DC

## 2022-05-27 ENCOUNTER — Ambulatory Visit
Admission: RE | Admit: 2022-05-27 | Discharge: 2022-05-27 | Disposition: A | Discharge status: Home / Self Care | Payer: Medicare Other | Source: Ambulatory Visit | Attending: Radiation Oncology | Admitting: Radiation Oncology

## 2022-05-27 ENCOUNTER — Other Ambulatory Visit: Payer: Self-pay

## 2022-05-27 DIAGNOSIS — Z510 Encounter for antineoplastic radiation therapy: Secondary | ICD-10-CM | POA: Diagnosis not present

## 2022-05-27 LAB — RAD ONC ARIA SESSION SUMMARY
Course Elapsed Days: 15
Plan Fractions Treated to Date: 11
Plan Prescribed Dose Per Fraction: 1.8 Gy
Plan Total Fractions Prescribed: 25
Plan Total Prescribed Dose: 45 Gy
Reference Point Dosage Given to Date: 19.8 Gy
Reference Point Session Dosage Given: 1.8 Gy
Session Number: 11

## 2022-05-28 ENCOUNTER — Other Ambulatory Visit: Payer: Self-pay

## 2022-05-28 ENCOUNTER — Ambulatory Visit
Admission: RE | Admit: 2022-05-28 | Discharge: 2022-05-28 | Disposition: A | Discharge status: Home / Self Care | Payer: Medicare Other | Source: Ambulatory Visit | Attending: Radiation Oncology | Admitting: Radiation Oncology

## 2022-05-28 DIAGNOSIS — Z510 Encounter for antineoplastic radiation therapy: Secondary | ICD-10-CM | POA: Diagnosis not present

## 2022-05-28 LAB — RAD ONC ARIA SESSION SUMMARY
Course Elapsed Days: 16
Plan Fractions Treated to Date: 12
Plan Prescribed Dose Per Fraction: 1.8 Gy
Plan Total Fractions Prescribed: 25
Plan Total Prescribed Dose: 45 Gy
Reference Point Dosage Given to Date: 21.6 Gy
Reference Point Session Dosage Given: 1.8 Gy
Session Number: 12

## 2022-05-29 ENCOUNTER — Other Ambulatory Visit: Payer: Self-pay

## 2022-05-29 ENCOUNTER — Ambulatory Visit
Admission: RE | Admit: 2022-05-29 | Discharge: 2022-05-29 | Disposition: A | Discharge status: Home / Self Care | Payer: Medicare Other | Source: Ambulatory Visit | Attending: Radiation Oncology | Admitting: Radiation Oncology

## 2022-05-29 DIAGNOSIS — C61 Malignant neoplasm of prostate: Secondary | ICD-10-CM | POA: Insufficient documentation

## 2022-05-29 LAB — RAD ONC ARIA SESSION SUMMARY
Course Elapsed Days: 17
Plan Fractions Treated to Date: 13
Plan Prescribed Dose Per Fraction: 1.8 Gy
Plan Total Fractions Prescribed: 25
Plan Total Prescribed Dose: 45 Gy
Reference Point Dosage Given to Date: 23.4 Gy
Reference Point Session Dosage Given: 1.8 Gy
Session Number: 13

## 2022-05-30 ENCOUNTER — Ambulatory Visit
Admission: RE | Admit: 2022-05-30 | Discharge: 2022-05-30 | Disposition: A | Discharge status: Home / Self Care | Payer: Medicare Other | Source: Ambulatory Visit | Attending: Radiation Oncology | Admitting: Radiation Oncology

## 2022-05-30 ENCOUNTER — Other Ambulatory Visit: Payer: Self-pay

## 2022-05-30 DIAGNOSIS — C61 Malignant neoplasm of prostate: Secondary | ICD-10-CM | POA: Diagnosis not present

## 2022-05-30 LAB — RAD ONC ARIA SESSION SUMMARY
Course Elapsed Days: 18
Plan Fractions Treated to Date: 14
Plan Prescribed Dose Per Fraction: 1.8 Gy
Plan Total Fractions Prescribed: 25
Plan Total Prescribed Dose: 45 Gy
Reference Point Dosage Given to Date: 25.2 Gy
Reference Point Session Dosage Given: 1.8 Gy
Session Number: 14

## 2022-06-02 ENCOUNTER — Ambulatory Visit
Admission: RE | Admit: 2022-06-02 | Discharge: 2022-06-02 | Disposition: A | Discharge status: Home / Self Care | Payer: Medicare Other | Source: Ambulatory Visit | Attending: Radiation Oncology | Admitting: Radiation Oncology

## 2022-06-02 ENCOUNTER — Other Ambulatory Visit: Payer: Self-pay

## 2022-06-02 DIAGNOSIS — C61 Malignant neoplasm of prostate: Secondary | ICD-10-CM | POA: Diagnosis not present

## 2022-06-02 LAB — RAD ONC ARIA SESSION SUMMARY
Course Elapsed Days: 21
Plan Fractions Treated to Date: 15
Plan Prescribed Dose Per Fraction: 1.8 Gy
Plan Total Fractions Prescribed: 25
Plan Total Prescribed Dose: 45 Gy
Reference Point Dosage Given to Date: 27 Gy
Reference Point Session Dosage Given: 1.8 Gy
Session Number: 15

## 2022-06-03 ENCOUNTER — Ambulatory Visit
Admission: RE | Admit: 2022-06-03 | Discharge: 2022-06-03 | Disposition: A | Discharge status: Home / Self Care | Payer: Medicare Other | Source: Ambulatory Visit | Attending: Radiation Oncology | Admitting: Radiation Oncology

## 2022-06-03 ENCOUNTER — Other Ambulatory Visit: Payer: Self-pay

## 2022-06-03 DIAGNOSIS — C61 Malignant neoplasm of prostate: Secondary | ICD-10-CM | POA: Diagnosis not present

## 2022-06-03 LAB — RAD ONC ARIA SESSION SUMMARY
Course Elapsed Days: 22
Plan Fractions Treated to Date: 16
Plan Prescribed Dose Per Fraction: 1.8 Gy
Plan Total Fractions Prescribed: 25
Plan Total Prescribed Dose: 45 Gy
Reference Point Dosage Given to Date: 28.8 Gy
Reference Point Session Dosage Given: 1.8 Gy
Session Number: 16

## 2022-06-04 ENCOUNTER — Other Ambulatory Visit: Payer: Self-pay

## 2022-06-04 ENCOUNTER — Ambulatory Visit
Admission: RE | Admit: 2022-06-04 | Discharge: 2022-06-04 | Disposition: A | Discharge status: Home / Self Care | Payer: Medicare Other | Source: Ambulatory Visit | Attending: Radiation Oncology | Admitting: Radiation Oncology

## 2022-06-04 DIAGNOSIS — C61 Malignant neoplasm of prostate: Secondary | ICD-10-CM | POA: Diagnosis not present

## 2022-06-04 LAB — RAD ONC ARIA SESSION SUMMARY
Course Elapsed Days: 23
Plan Fractions Treated to Date: 17
Plan Prescribed Dose Per Fraction: 1.8 Gy
Plan Total Fractions Prescribed: 25
Plan Total Prescribed Dose: 45 Gy
Reference Point Dosage Given to Date: 30.6 Gy
Reference Point Session Dosage Given: 1.8 Gy
Session Number: 17

## 2022-06-05 ENCOUNTER — Other Ambulatory Visit: Payer: Self-pay

## 2022-06-05 ENCOUNTER — Ambulatory Visit
Admission: RE | Admit: 2022-06-05 | Discharge: 2022-06-05 | Disposition: A | Discharge status: Home / Self Care | Payer: Medicare Other | Source: Ambulatory Visit | Attending: Radiation Oncology | Admitting: Radiation Oncology

## 2022-06-05 DIAGNOSIS — C61 Malignant neoplasm of prostate: Secondary | ICD-10-CM | POA: Diagnosis not present

## 2022-06-05 LAB — RAD ONC ARIA SESSION SUMMARY
Course Elapsed Days: 24
Plan Fractions Treated to Date: 18
Plan Prescribed Dose Per Fraction: 1.8 Gy
Plan Total Fractions Prescribed: 25
Plan Total Prescribed Dose: 45 Gy
Reference Point Dosage Given to Date: 32.4 Gy
Reference Point Session Dosage Given: 1.8 Gy
Session Number: 18

## 2022-06-06 ENCOUNTER — Other Ambulatory Visit: Payer: Self-pay

## 2022-06-06 ENCOUNTER — Ambulatory Visit
Admission: RE | Admit: 2022-06-06 | Discharge: 2022-06-06 | Disposition: A | Discharge status: Home / Self Care | Payer: Medicare Other | Source: Ambulatory Visit | Attending: Radiation Oncology | Admitting: Radiation Oncology

## 2022-06-06 DIAGNOSIS — C61 Malignant neoplasm of prostate: Secondary | ICD-10-CM | POA: Diagnosis not present

## 2022-06-06 LAB — RAD ONC ARIA SESSION SUMMARY
Course Elapsed Days: 25
Plan Fractions Treated to Date: 19
Plan Prescribed Dose Per Fraction: 1.8 Gy
Plan Total Fractions Prescribed: 25
Plan Total Prescribed Dose: 45 Gy
Reference Point Dosage Given to Date: 34.2 Gy
Reference Point Session Dosage Given: 1.8 Gy
Session Number: 19

## 2022-06-09 ENCOUNTER — Ambulatory Visit
Admission: RE | Admit: 2022-06-09 | Discharge: 2022-06-09 | Disposition: A | Discharge status: Home / Self Care | Payer: Medicare Other | Source: Ambulatory Visit | Attending: Radiation Oncology | Admitting: Radiation Oncology

## 2022-06-09 ENCOUNTER — Other Ambulatory Visit: Payer: Self-pay

## 2022-06-09 DIAGNOSIS — C61 Malignant neoplasm of prostate: Secondary | ICD-10-CM | POA: Diagnosis not present

## 2022-06-09 LAB — RAD ONC ARIA SESSION SUMMARY
Course Elapsed Days: 28
Plan Fractions Treated to Date: 20
Plan Prescribed Dose Per Fraction: 1.8 Gy
Plan Total Fractions Prescribed: 25
Plan Total Prescribed Dose: 45 Gy
Reference Point Dosage Given to Date: 36 Gy
Reference Point Session Dosage Given: 1.8 Gy
Session Number: 20

## 2022-06-10 ENCOUNTER — Ambulatory Visit
Admission: RE | Admit: 2022-06-10 | Discharge: 2022-06-10 | Disposition: A | Discharge status: Home / Self Care | Payer: Medicare Other | Source: Ambulatory Visit | Attending: Radiation Oncology | Admitting: Radiation Oncology

## 2022-06-10 ENCOUNTER — Other Ambulatory Visit: Payer: Self-pay

## 2022-06-10 DIAGNOSIS — C61 Malignant neoplasm of prostate: Secondary | ICD-10-CM | POA: Diagnosis not present

## 2022-06-10 LAB — RAD ONC ARIA SESSION SUMMARY
Course Elapsed Days: 29
Plan Fractions Treated to Date: 21
Plan Prescribed Dose Per Fraction: 1.8 Gy
Plan Total Fractions Prescribed: 25
Plan Total Prescribed Dose: 45 Gy
Reference Point Dosage Given to Date: 37.8 Gy
Reference Point Session Dosage Given: 1.8 Gy
Session Number: 21

## 2022-06-11 ENCOUNTER — Other Ambulatory Visit: Payer: Self-pay

## 2022-06-11 ENCOUNTER — Ambulatory Visit
Admission: RE | Admit: 2022-06-11 | Discharge: 2022-06-11 | Disposition: A | Discharge status: Home / Self Care | Payer: Medicare Other | Source: Ambulatory Visit | Attending: Radiation Oncology | Admitting: Radiation Oncology

## 2022-06-11 DIAGNOSIS — C61 Malignant neoplasm of prostate: Secondary | ICD-10-CM | POA: Diagnosis not present

## 2022-06-11 LAB — RAD ONC ARIA SESSION SUMMARY
Course Elapsed Days: 30
Plan Fractions Treated to Date: 22
Plan Prescribed Dose Per Fraction: 1.8 Gy
Plan Total Fractions Prescribed: 25
Plan Total Prescribed Dose: 45 Gy
Reference Point Dosage Given to Date: 39.6 Gy
Reference Point Session Dosage Given: 1.8 Gy
Session Number: 22

## 2022-06-12 ENCOUNTER — Ambulatory Visit
Admission: RE | Admit: 2022-06-12 | Discharge: 2022-06-12 | Disposition: A | Discharge status: Home / Self Care | Payer: Medicare Other | Source: Ambulatory Visit | Attending: Radiation Oncology | Admitting: Radiation Oncology

## 2022-06-12 ENCOUNTER — Other Ambulatory Visit: Payer: Self-pay

## 2022-06-12 DIAGNOSIS — C61 Malignant neoplasm of prostate: Secondary | ICD-10-CM | POA: Diagnosis not present

## 2022-06-12 LAB — RAD ONC ARIA SESSION SUMMARY
Course Elapsed Days: 31
Plan Fractions Treated to Date: 23
Plan Prescribed Dose Per Fraction: 1.8 Gy
Plan Total Fractions Prescribed: 25
Plan Total Prescribed Dose: 45 Gy
Reference Point Dosage Given to Date: 41.4 Gy
Reference Point Session Dosage Given: 1.8 Gy
Session Number: 23

## 2022-06-13 ENCOUNTER — Ambulatory Visit
Admission: RE | Admit: 2022-06-13 | Discharge: 2022-06-13 | Disposition: A | Discharge status: Home / Self Care | Payer: Medicare Other | Source: Ambulatory Visit | Attending: Radiation Oncology | Admitting: Radiation Oncology

## 2022-06-13 ENCOUNTER — Other Ambulatory Visit: Payer: Self-pay

## 2022-06-13 ENCOUNTER — Ambulatory Visit: Payer: Medicare Other

## 2022-06-13 DIAGNOSIS — C61 Malignant neoplasm of prostate: Secondary | ICD-10-CM

## 2022-06-13 LAB — URINALYSIS, COMPLETE (UACMP) WITH MICROSCOPIC
Bacteria, UA: NONE SEEN
Bilirubin Urine: NEGATIVE
Glucose, UA: NEGATIVE mg/dL
Hgb urine dipstick: NEGATIVE
Ketones, ur: NEGATIVE mg/dL
Leukocytes,Ua: NEGATIVE
Nitrite: POSITIVE — AB
Protein, ur: 30 mg/dL — AB
Specific Gravity, Urine: 1.021 (ref 1.005–1.030)
pH: 5 (ref 5.0–8.0)

## 2022-06-13 LAB — RAD ONC ARIA SESSION SUMMARY
Course Elapsed Days: 32
Plan Fractions Treated to Date: 24
Plan Prescribed Dose Per Fraction: 1.8 Gy
Plan Total Fractions Prescribed: 25
Plan Total Prescribed Dose: 45 Gy
Reference Point Dosage Given to Date: 43.2 Gy
Reference Point Session Dosage Given: 1.8 Gy
Session Number: 24

## 2022-06-13 MED ORDER — NITROFURANTOIN MONOHYD MACRO 100 MG PO CAPS: 100.0000 mg | ORAL_CAPSULE | Freq: Two times a day (BID) | ORAL | 0 refills | Status: AC

## 2022-06-14 LAB — URINE CULTURE: Culture: NO GROWTH

## 2022-06-16 ENCOUNTER — Ambulatory Visit
Admission: RE | Admit: 2022-06-16 | Discharge: 2022-06-16 | Disposition: A | Discharge status: Home / Self Care | Payer: Medicare Other | Source: Ambulatory Visit | Attending: Radiation Oncology | Admitting: Radiation Oncology

## 2022-06-16 ENCOUNTER — Ambulatory Visit: Payer: Medicare Other

## 2022-06-16 ENCOUNTER — Other Ambulatory Visit: Payer: Self-pay

## 2022-06-16 DIAGNOSIS — C61 Malignant neoplasm of prostate: Secondary | ICD-10-CM | POA: Diagnosis not present

## 2022-06-16 LAB — RAD ONC ARIA SESSION SUMMARY
Course Elapsed Days: 35
Plan Fractions Treated to Date: 25
Plan Prescribed Dose Per Fraction: 1.8 Gy
Plan Total Fractions Prescribed: 25
Plan Total Prescribed Dose: 45 Gy
Reference Point Dosage Given to Date: 45 Gy
Reference Point Session Dosage Given: 1.8 Gy
Session Number: 25

## 2022-06-17 ENCOUNTER — Ambulatory Visit: Payer: Medicare Other

## 2022-06-17 ENCOUNTER — Other Ambulatory Visit: Payer: Self-pay | Admitting: Radiation Oncology

## 2022-06-17 ENCOUNTER — Telehealth: Payer: Self-pay

## 2022-06-17 ENCOUNTER — Other Ambulatory Visit: Payer: Self-pay

## 2022-06-17 DIAGNOSIS — C61 Malignant neoplasm of prostate: Secondary | ICD-10-CM | POA: Diagnosis not present

## 2022-06-17 LAB — RAD ONC ARIA SESSION SUMMARY
Course Elapsed Days: 36
Plan Fractions Treated to Date: 1
Plan Prescribed Dose Per Fraction: 2 Gy
Plan Total Fractions Prescribed: 15
Plan Total Prescribed Dose: 30 Gy
Reference Point Dosage Given to Date: 47 Gy
Reference Point Session Dosage Given: 2 Gy
Session Number: 26

## 2022-06-17 MED ORDER — URIBEL 118 MG PO CAPS: 1.0000 | ORAL_CAPSULE | Freq: Four times a day (QID) | ORAL | 3 refills | Status: AC | PRN

## 2022-06-17 NOTE — Telephone Encounter (Signed)
Spoke with patient pharmacy had called about new prescription, but patient decided to pay for medication out of pocket since he's insurance wouldn't cover cost.  No further concerns.

## 2022-06-18 ENCOUNTER — Other Ambulatory Visit: Payer: Self-pay

## 2022-06-18 ENCOUNTER — Ambulatory Visit
Admission: RE | Admit: 2022-06-18 | Discharge: 2022-06-18 | Disposition: A | Discharge status: Home / Self Care | Payer: Medicare Other | Source: Ambulatory Visit | Attending: Radiation Oncology | Admitting: Radiation Oncology

## 2022-06-18 DIAGNOSIS — C61 Malignant neoplasm of prostate: Secondary | ICD-10-CM | POA: Diagnosis not present

## 2022-06-18 LAB — RAD ONC ARIA SESSION SUMMARY
Course Elapsed Days: 37
Plan Fractions Treated to Date: 2
Plan Prescribed Dose Per Fraction: 2 Gy
Plan Total Fractions Prescribed: 15
Plan Total Prescribed Dose: 30 Gy
Reference Point Dosage Given to Date: 49 Gy
Reference Point Session Dosage Given: 2 Gy
Session Number: 27

## 2022-06-19 ENCOUNTER — Ambulatory Visit
Admission: RE | Admit: 2022-06-19 | Discharge: 2022-06-19 | Disposition: A | Discharge status: Home / Self Care | Payer: Medicare Other | Source: Ambulatory Visit | Attending: Radiation Oncology | Admitting: Radiation Oncology

## 2022-06-19 ENCOUNTER — Other Ambulatory Visit: Payer: Self-pay

## 2022-06-19 DIAGNOSIS — C61 Malignant neoplasm of prostate: Secondary | ICD-10-CM | POA: Diagnosis not present

## 2022-06-19 LAB — RAD ONC ARIA SESSION SUMMARY
Course Elapsed Days: 38
Plan Fractions Treated to Date: 3
Plan Prescribed Dose Per Fraction: 2 Gy
Plan Total Fractions Prescribed: 15
Plan Total Prescribed Dose: 30 Gy
Reference Point Dosage Given to Date: 51 Gy
Reference Point Session Dosage Given: 2 Gy
Session Number: 28

## 2022-06-20 ENCOUNTER — Ambulatory Visit: Payer: Medicare Other

## 2022-06-20 ENCOUNTER — Other Ambulatory Visit: Payer: Self-pay

## 2022-06-20 ENCOUNTER — Ambulatory Visit
Admission: RE | Admit: 2022-06-20 | Discharge: 2022-06-20 | Disposition: A | Discharge status: Home / Self Care | Payer: Medicare Other | Source: Ambulatory Visit | Attending: Radiation Oncology | Admitting: Radiation Oncology

## 2022-06-20 DIAGNOSIS — C61 Malignant neoplasm of prostate: Secondary | ICD-10-CM | POA: Diagnosis not present

## 2022-06-20 LAB — RAD ONC ARIA SESSION SUMMARY
Course Elapsed Days: 39
Plan Fractions Treated to Date: 4
Plan Prescribed Dose Per Fraction: 2 Gy
Plan Total Fractions Prescribed: 15
Plan Total Prescribed Dose: 30 Gy
Reference Point Dosage Given to Date: 53 Gy
Reference Point Session Dosage Given: 2 Gy
Session Number: 29

## 2022-06-23 ENCOUNTER — Ambulatory Visit
Admission: RE | Admit: 2022-06-23 | Discharge: 2022-06-23 | Disposition: A | Discharge status: Home / Self Care | Payer: Medicare Other | Source: Ambulatory Visit | Attending: Radiation Oncology | Admitting: Radiation Oncology

## 2022-06-23 ENCOUNTER — Other Ambulatory Visit: Payer: Self-pay | Admitting: Radiation Oncology

## 2022-06-23 ENCOUNTER — Other Ambulatory Visit: Payer: Self-pay

## 2022-06-23 DIAGNOSIS — C61 Malignant neoplasm of prostate: Secondary | ICD-10-CM | POA: Diagnosis not present

## 2022-06-23 LAB — RAD ONC ARIA SESSION SUMMARY
Course Elapsed Days: 42
Plan Fractions Treated to Date: 5
Plan Prescribed Dose Per Fraction: 2 Gy
Plan Total Fractions Prescribed: 15
Plan Total Prescribed Dose: 30 Gy
Reference Point Dosage Given to Date: 55 Gy
Reference Point Session Dosage Given: 2 Gy
Session Number: 30

## 2022-06-24 ENCOUNTER — Other Ambulatory Visit: Payer: Self-pay

## 2022-06-24 ENCOUNTER — Ambulatory Visit: Payer: Medicare Other

## 2022-06-24 ENCOUNTER — Ambulatory Visit
Admission: RE | Admit: 2022-06-24 | Discharge: 2022-06-24 | Disposition: A | Discharge status: Home / Self Care | Payer: Medicare Other | Source: Ambulatory Visit | Attending: Radiation Oncology | Admitting: Radiation Oncology

## 2022-06-24 DIAGNOSIS — C61 Malignant neoplasm of prostate: Secondary | ICD-10-CM | POA: Diagnosis not present

## 2022-06-24 LAB — RAD ONC ARIA SESSION SUMMARY
Course Elapsed Days: 43
Plan Fractions Treated to Date: 6
Plan Prescribed Dose Per Fraction: 2 Gy
Plan Total Fractions Prescribed: 15
Plan Total Prescribed Dose: 30 Gy
Reference Point Dosage Given to Date: 57 Gy
Reference Point Session Dosage Given: 2 Gy
Session Number: 31

## 2022-06-25 ENCOUNTER — Ambulatory Visit
Admission: RE | Admit: 2022-06-25 | Discharge: 2022-06-25 | Disposition: A | Discharge status: Home / Self Care | Payer: Medicare Other | Source: Ambulatory Visit | Attending: Radiation Oncology | Admitting: Radiation Oncology

## 2022-06-25 ENCOUNTER — Other Ambulatory Visit: Payer: Self-pay

## 2022-06-25 DIAGNOSIS — C61 Malignant neoplasm of prostate: Secondary | ICD-10-CM | POA: Diagnosis not present

## 2022-06-25 LAB — RAD ONC ARIA SESSION SUMMARY
Course Elapsed Days: 44
Plan Fractions Treated to Date: 7
Plan Prescribed Dose Per Fraction: 2 Gy
Plan Total Fractions Prescribed: 15
Plan Total Prescribed Dose: 30 Gy
Reference Point Dosage Given to Date: 59 Gy
Reference Point Session Dosage Given: 2 Gy
Session Number: 32

## 2022-06-26 ENCOUNTER — Other Ambulatory Visit: Payer: Self-pay

## 2022-06-26 ENCOUNTER — Ambulatory Visit
Admission: RE | Admit: 2022-06-26 | Discharge: 2022-06-26 | Disposition: A | Discharge status: Home / Self Care | Payer: Medicare Other | Source: Ambulatory Visit | Attending: Radiation Oncology | Admitting: Radiation Oncology

## 2022-06-26 DIAGNOSIS — C61 Malignant neoplasm of prostate: Secondary | ICD-10-CM | POA: Diagnosis not present

## 2022-06-26 LAB — RAD ONC ARIA SESSION SUMMARY
Course Elapsed Days: 45
Plan Fractions Treated to Date: 8
Plan Prescribed Dose Per Fraction: 2 Gy
Plan Total Fractions Prescribed: 15
Plan Total Prescribed Dose: 30 Gy
Reference Point Dosage Given to Date: 61 Gy
Reference Point Session Dosage Given: 2 Gy
Session Number: 33

## 2022-06-27 ENCOUNTER — Other Ambulatory Visit: Payer: Self-pay

## 2022-06-27 ENCOUNTER — Ambulatory Visit
Admission: RE | Admit: 2022-06-27 | Discharge: 2022-06-27 | Disposition: A | Discharge status: Home / Self Care | Payer: Medicare Other | Source: Ambulatory Visit | Attending: Radiation Oncology | Admitting: Radiation Oncology

## 2022-06-27 DIAGNOSIS — C61 Malignant neoplasm of prostate: Secondary | ICD-10-CM | POA: Diagnosis not present

## 2022-06-27 LAB — RAD ONC ARIA SESSION SUMMARY
Course Elapsed Days: 46
Plan Fractions Treated to Date: 9
Plan Prescribed Dose Per Fraction: 2 Gy
Plan Total Fractions Prescribed: 15
Plan Total Prescribed Dose: 30 Gy
Reference Point Dosage Given to Date: 63 Gy
Reference Point Session Dosage Given: 2 Gy
Session Number: 34

## 2022-06-30 ENCOUNTER — Ambulatory Visit: Payer: Medicare Other

## 2022-06-30 ENCOUNTER — Ambulatory Visit
Admission: RE | Admit: 2022-06-30 | Discharge: 2022-06-30 | Disposition: A | Discharge status: Home / Self Care | Payer: Medicare Other | Source: Ambulatory Visit | Attending: Radiation Oncology | Admitting: Radiation Oncology

## 2022-06-30 ENCOUNTER — Other Ambulatory Visit: Payer: Self-pay

## 2022-06-30 DIAGNOSIS — C61 Malignant neoplasm of prostate: Secondary | ICD-10-CM | POA: Diagnosis not present

## 2022-06-30 LAB — RAD ONC ARIA SESSION SUMMARY
Course Elapsed Days: 49
Plan Fractions Treated to Date: 10
Plan Prescribed Dose Per Fraction: 2 Gy
Plan Total Fractions Prescribed: 15
Plan Total Prescribed Dose: 30 Gy
Reference Point Dosage Given to Date: 65 Gy
Reference Point Session Dosage Given: 2 Gy
Session Number: 35

## 2022-07-01 ENCOUNTER — Ambulatory Visit: Payer: Medicare Other

## 2022-07-02 ENCOUNTER — Other Ambulatory Visit: Payer: Self-pay

## 2022-07-02 ENCOUNTER — Ambulatory Visit
Admission: RE | Admit: 2022-07-02 | Discharge: 2022-07-02 | Disposition: A | Discharge status: Home / Self Care | Payer: Medicare Other | Source: Ambulatory Visit | Attending: Radiation Oncology | Admitting: Radiation Oncology

## 2022-07-02 DIAGNOSIS — C61 Malignant neoplasm of prostate: Secondary | ICD-10-CM | POA: Diagnosis not present

## 2022-07-02 LAB — RAD ONC ARIA SESSION SUMMARY
Course Elapsed Days: 51
Plan Fractions Treated to Date: 11
Plan Prescribed Dose Per Fraction: 2 Gy
Plan Total Fractions Prescribed: 15
Plan Total Prescribed Dose: 30 Gy
Reference Point Dosage Given to Date: 67 Gy
Reference Point Session Dosage Given: 2 Gy
Session Number: 36

## 2022-07-03 ENCOUNTER — Ambulatory Visit
Admission: RE | Admit: 2022-07-03 | Discharge: 2022-07-03 | Disposition: A | Discharge status: Home / Self Care | Payer: Medicare Other | Source: Ambulatory Visit | Attending: Radiation Oncology | Admitting: Radiation Oncology

## 2022-07-03 ENCOUNTER — Other Ambulatory Visit: Payer: Self-pay

## 2022-07-03 DIAGNOSIS — C61 Malignant neoplasm of prostate: Secondary | ICD-10-CM | POA: Diagnosis not present

## 2022-07-03 LAB — RAD ONC ARIA SESSION SUMMARY
Course Elapsed Days: 52
Plan Fractions Treated to Date: 12
Plan Prescribed Dose Per Fraction: 2 Gy
Plan Total Fractions Prescribed: 15
Plan Total Prescribed Dose: 30 Gy
Reference Point Dosage Given to Date: 69 Gy
Reference Point Session Dosage Given: 2 Gy
Session Number: 37

## 2022-07-04 ENCOUNTER — Other Ambulatory Visit: Payer: Self-pay

## 2022-07-04 ENCOUNTER — Ambulatory Visit
Admission: RE | Admit: 2022-07-04 | Discharge: 2022-07-04 | Disposition: A | Discharge status: Home / Self Care | Payer: Medicare Other | Source: Ambulatory Visit | Attending: Radiation Oncology | Admitting: Radiation Oncology

## 2022-07-04 DIAGNOSIS — C61 Malignant neoplasm of prostate: Secondary | ICD-10-CM | POA: Diagnosis not present

## 2022-07-04 LAB — RAD ONC ARIA SESSION SUMMARY
Course Elapsed Days: 53
Plan Fractions Treated to Date: 13
Plan Prescribed Dose Per Fraction: 2 Gy
Plan Total Fractions Prescribed: 15
Plan Total Prescribed Dose: 30 Gy
Reference Point Dosage Given to Date: 71 Gy
Reference Point Session Dosage Given: 2 Gy
Session Number: 38

## 2022-07-07 ENCOUNTER — Ambulatory Visit: Payer: Medicare Other

## 2022-07-08 ENCOUNTER — Ambulatory Visit: Payer: Medicare Other

## 2022-07-08 ENCOUNTER — Ambulatory Visit
Admission: RE | Admit: 2022-07-08 | Discharge: 2022-07-08 | Disposition: A | Discharge status: Home / Self Care | Payer: Medicare Other | Source: Ambulatory Visit | Attending: Radiation Oncology | Admitting: Radiation Oncology

## 2022-07-08 ENCOUNTER — Other Ambulatory Visit: Payer: Self-pay

## 2022-07-08 DIAGNOSIS — C61 Malignant neoplasm of prostate: Secondary | ICD-10-CM | POA: Diagnosis not present

## 2022-07-08 LAB — RAD ONC ARIA SESSION SUMMARY
Course Elapsed Days: 57
Plan Fractions Treated to Date: 14
Plan Prescribed Dose Per Fraction: 2 Gy
Plan Total Fractions Prescribed: 15
Plan Total Prescribed Dose: 30 Gy
Reference Point Dosage Given to Date: 73 Gy
Reference Point Session Dosage Given: 2 Gy
Session Number: 39

## 2022-07-09 ENCOUNTER — Other Ambulatory Visit: Payer: Self-pay

## 2022-07-09 ENCOUNTER — Ambulatory Visit
Admission: RE | Admit: 2022-07-09 | Discharge: 2022-07-09 | Disposition: A | Discharge status: Home / Self Care | Payer: Medicare Other | Source: Ambulatory Visit | Attending: Radiation Oncology | Admitting: Radiation Oncology

## 2022-07-09 ENCOUNTER — Encounter: Payer: Self-pay | Admitting: Urology

## 2022-07-09 ENCOUNTER — Ambulatory Visit: Payer: Medicare Other

## 2022-07-09 DIAGNOSIS — C61 Malignant neoplasm of prostate: Secondary | ICD-10-CM | POA: Diagnosis not present

## 2022-07-09 LAB — RAD ONC ARIA SESSION SUMMARY
Course Elapsed Days: 58
Plan Fractions Treated to Date: 15
Plan Prescribed Dose Per Fraction: 2 Gy
Plan Total Fractions Prescribed: 15
Plan Total Prescribed Dose: 30 Gy
Reference Point Dosage Given to Date: 75 Gy
Reference Point Session Dosage Given: 2 Gy
Session Number: 40

## 2022-08-20 ENCOUNTER — Telehealth: Payer: Self-pay

## 2022-08-20 NOTE — Telephone Encounter (Signed)
Left voicemail in reference to patient's 08/21/22 telephone appt and requested that patient return my call [phone].

## 2022-08-21 ENCOUNTER — Ambulatory Visit
Admission: RE | Admit: 2022-08-21 | Discharge: 2022-08-21 | Disposition: A | Discharge status: Home / Self Care | Payer: Medicare Other | Source: Ambulatory Visit | Attending: Urology | Admitting: Urology

## 2022-08-21 ENCOUNTER — Ambulatory Visit: Admission: RE | Admit: 2022-08-21 | Payer: Medicare Other | Source: Ambulatory Visit | Admitting: Urology

## 2022-08-21 ENCOUNTER — Telehealth: Payer: Self-pay

## 2022-08-21 ENCOUNTER — Encounter: Payer: Self-pay | Admitting: Urology

## 2022-08-21 DIAGNOSIS — C61 Malignant neoplasm of prostate: Secondary | ICD-10-CM

## 2022-08-21 NOTE — Progress Notes (Addendum)
  Radiation Oncology         [phone] ________________________________  Name: Blake [name] MRN: [id number]  Date: 07/09/2022  DOB: [birth year]  End of Treatment Note  Diagnosis:   71 y.o. 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     Indication for treatment:  Curative, Definitive Radiotherapy       Radiation treatment dates:   05/12/22 - 07/09/22  Site/dose:  1. The prostate, seminal vesicles, and pelvic lymph nodes were initially treated to 45 Gy in 25 fractions of 1.8 Gy  2. The prostate only was boosted to 75 Gy with 15 additional fractions of 2.0 Gy   Beams/energy:  1. The prostate, seminal vesicles, and pelvic lymph nodes were initially treated using VMAT intensity modulated radiotherapy delivering 6 megavolt photons. Image guidance was performed with CB-CT studies prior to each fraction. He was immobilized with a body fix lower extremity mold.  2. the prostate only was boosted using VMAT intensity modulated radiotherapy delivering 6 megavolt photons. Image guidance was performed with CB-CT studies prior to each fraction. He was immobilized with a body fix lower extremity mold.  Narrative: The patient tolerated radiation treatment relatively well.  He did experience some moderate-severe urinary irritation and modest fatigue.  He associated some of the LUTS to his underlying autoimmune disease as he chronically has issues with frequency/urgency and radiation just seems to have exacerbated those along with dysuria but he denies gross hematuria, fever or chills. Symptoms did improve with Flomax.  Plan: The patient has completed radiation treatment. He will return to radiation oncology clinic for routine followup in one month. I advised him to call or return sooner if he has any questions or concerns related to his recovery or treatment. ________________________________  Sheral Apley. Tammi Klippel, M.D.

## 2022-08-21 NOTE — Progress Notes (Signed)
Telephone appointment. I verified patient's identity and began nursing interview. Patient reports polyuria. No other issues reported at this time.  Meaningful use complete. I-PSS score of 4-mild. Flomax as directed. Urology appt-None currently  Patient nursing interview complete.  Patient contact [phone]

## 2022-08-21 NOTE — Telephone Encounter (Signed)
Called pt. X3, voicemail's left in reference to patient's 9:00am-08/21/22 telephone appointment. Unable to reach patient for today's appt.

## 2022-08-21 NOTE — Progress Notes (Signed)
Telephone appointment. I verified patient's identity and began nursing interview. Patient reports polyuria. No other issues reported at this time.   Meaningful use complete. I-PSS score of 4-mild. Flomax as directed. Urology appt- Aug, 2023   Patient nursing interview complete.   Patient contact [phone]

## 2022-08-22 ENCOUNTER — Other Ambulatory Visit: Payer: Self-pay | Admitting: Urology

## 2022-08-22 DIAGNOSIS — C61 Malignant neoplasm of prostate: Secondary | ICD-10-CM

## 2022-08-22 NOTE — Progress Notes (Signed)
Radiation Oncology         [phone] ________________________________  Name: Blake [name] MRN: [id number]  Date: 08/21/2022  DOB: December 31, 1950  Post Treatment Note  CC: Hayden Rasmussen, MD  Festus Aloe, MD  Diagnosis:   71 y.o. 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     Interval Since Last Radiation:  6 weeks  05/12/22 - 07/09/22   Site/dose:  1. The prostate, seminal vesicles, and pelvic lymph nodes were initially treated to 45 Gy in 25 fractions of 1.8 Gy followed by a boost, to the prostate only, to 75 Gy with 15 additional fractions of 2.0 Gy .  Narrative:  I spoke with the patient to conduct his routine scheduled 1 month follow up visit via telephone to spare the patient unnecessary potential exposure in the healthcare setting during the current COVID-19 pandemic.  The patient was notified in advance and gave permission to proceed with this visit format.  He tolerated radiation treatment relatively well.  He did experience some moderate-severe urinary irritation and modest fatigue.  He associated some of the LUTS to his underlying autoimmune disease as he chronically has issues with frequency/urgency and radiation just seems to have exacerbated those along with dysuria but he denied gross hematuria, fever or chills. Symptoms did improve with Flomax.                              On review of systems, the patient states that he is doing well in general.  His LUTS are gradually improving and he has continued taking Flomax daily as prescribed.  He specifically denies dysuria, gross hematuria, straining to void, incomplete bladder emptying or incontinence.  He reports a healthy appetite and is maintaining his weight.  He denies abdominal pain, nausea, vomiting, diarrhea or constipation.  His energy level is gradually improving and he has been able to continue running daily throughout his course of treatment and is back up to  running 5 miles a day.  Overall, he is pleased with his progress to date.  ALLERGIES:  is allergic to azathioprine.  Meds: Current Outpatient Medications  Medication Sig Dispense Refill   Leuprolide Acetate, 6 Month, (LUPRON DEPOT, 85-MONTH,) 45 MG injection Inject 45 mg into the muscle every 6 (six) months. Last done 1 month to 6 weeks ago per pt on 04-23-2022     Meth-Hyo-M Bl-Na Phos-Ph Sal (URIBEL) 118 MG CAPS Take 1 capsule (118 mg total) by mouth every 6 (six) hours as needed (dysuria). 56 capsule 3   mycophenolate (CELLCEPT) 500 MG tablet Take 1,000 mg by mouth 2 (two) times daily.     tamsulosin (FLOMAX) 0.4 MG CAPS capsule Take 1 capsule (0.4 mg total) by mouth daily after supper. 30 capsule 5   No current facility-administered medications for this encounter.    Physical Findings:  vitals were not taken for this visit.   /10 Unable to assess due to telephone follow-up visit format.  Lab Findings: Lab Results  Component Value Date   WBC 11.2 (H) 09/09/2015   HGB 13.9 04/25/2022   HCT 41.0 04/25/2022   MCV 91.2 09/09/2015   PLT 301 09/09/2015     Radiographic Findings: No results found.  Impression/Plan: 1. 71 y.o. 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    He will continue to follow  up with urology for ongoing PSA determinations and has an appointment scheduled with Dr. Junious Silk on 08/27/2022. He understands what to expect with regards to PSA monitoring going forward. I will look forward to following his response to treatment via correspondence with urology, and would be happy to continue to participate in his care if clinically indicated. I talked to the patient about what to expect in the future, including his risk for erectile dysfunction and rectal bleeding. I encouraged him to call or return to the office if he has any questions regarding his previous radiation or possible radiation side effects. He was  comfortable with this plan and will follow up as needed.     Nicholos Johns, PA-C

## 2022-09-29 ENCOUNTER — Encounter: Payer: Self-pay | Admitting: *Deleted

## 2022-10-07 ENCOUNTER — Encounter: Payer: Self-pay | Admitting: *Deleted

## 2022-10-23 ENCOUNTER — Inpatient Hospital Stay: Payer: Medicare Other | Attending: Adult Health | Admitting: *Deleted

## 2022-10-23 DIAGNOSIS — C61 Malignant neoplasm of prostate: Secondary | ICD-10-CM

## 2022-10-23 NOTE — Progress Notes (Signed)
SCP reviewed and completed. 

## 2023-04-26 IMAGING — MR MR PROSTATE WO/W CM
12 series · 48 of 48 positions shown · IV contrast (multihance)
Comparison: None.

CLINICAL DATA: Elevated PSA level of 9.84 for on 10/08/2021.

EXAM:
MR PROSTATE WITHOUT AND WITH CONTRAST
TECHNIQUE: Multiplanar multisequence MRI images were obtained of the pelvis
centered about the prostate. Pre and post contrast images were
obtained.
CONTRAST:  15mL MULTIHANCE GADOBENATE DIMEGLUMINE 529 MG/ML IV SOLN

[Series 3: T2 · coronal · 3.0mm · 0.56mm/px · 1 of 23 slices shown (1 of 3)]
[im 1/23]
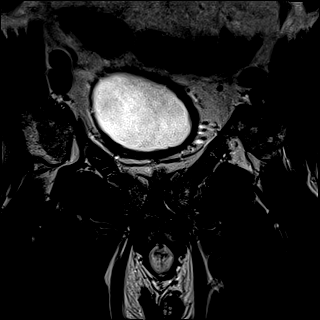

[Series 4: T1 · axial · 5.0mm · 1.25mm/px · 1 of 80 slices shown]
[im 1/80]
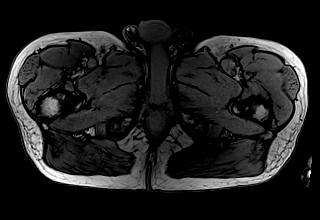

[Series 5: DWI · axial · 3.0mm · 1.75mm/px · z∈[-69,-3]mm · 2 of 69 slices shown (1 of 3)]
[im 1/69]
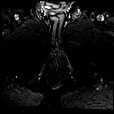
[im 69/69]
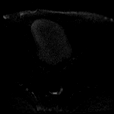

[Series 6: DWI · axial · 3.0mm · 1.75mm/px · 1 of 23 slices shown (2 of 3)]
[im 1/23]
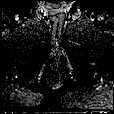

[Series 7: DWI · axial · 3.0mm · 1.75mm/px · 1 of 23 slices shown (3 of 3)]
[im 1/23]
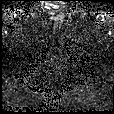

[Series 8: T2 · axial · 3.0mm · 0.56mm/px · 1 of 23 slices shown (2 of 3)]
[im 1/23]
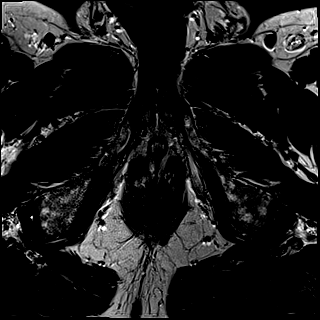

[Series 9: T2 · axial · 1.0mm · 1.04mm/px · z∈[-71,-0]mm · 2 of 72 slices shown (3 of 3)]
[im 1/72]
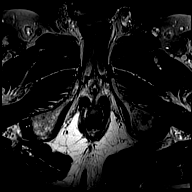
[im 72/72]
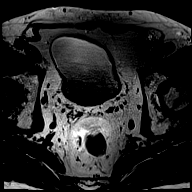

[Series 10: pre t1_twist_tra_dyn · axial · non-contrast · 3.5mm · 0.83mm/px · 1 of 20 slices shown]
[im 1/20]
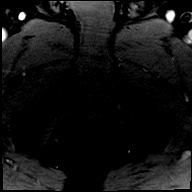

[Series 11: post t1_twist_tra_dyn-copy center · axial · non-contrast · 3.5mm · 0.83mm/px · z∈[-69,-2]mm · 17 of 600 slices shown]
[im 1/600]
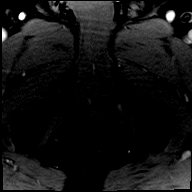
[im 38/600]
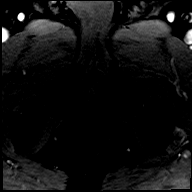
[im 75/600]
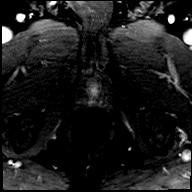
[im 113/600]
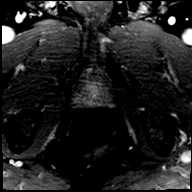
[im 150/600]
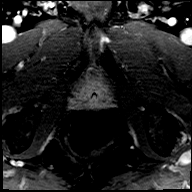
[im 188/600]
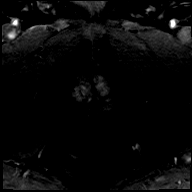
[im 225/600]
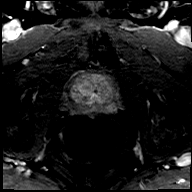
[im 263/600]
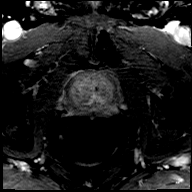
[im 300/600]
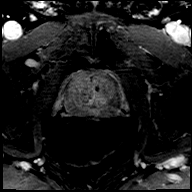
[im 337/600]
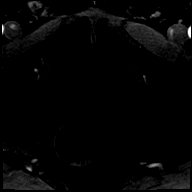
[im 375/600]
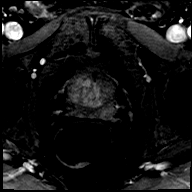
[im 412/600]
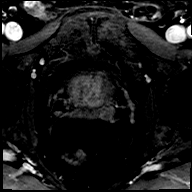
[im 450/600]
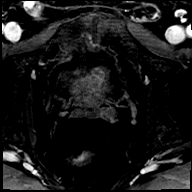
[im 487/600]
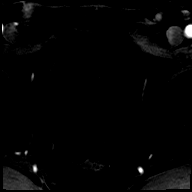
[im 525/600]
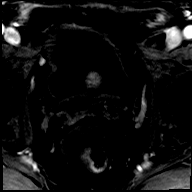
[im 562/600]
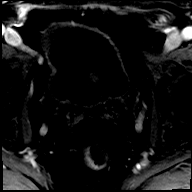
[im 600/600]
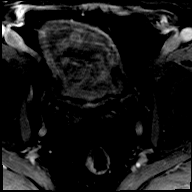

[Series 12: post t1_twist_tra_dyn-copy cent_sub · axial · 3.5mm · 0.83mm/px · z∈[-69,-2]mm · 17 of 580 slices shown]
[im 1/580]
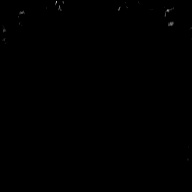
[im 37/580]
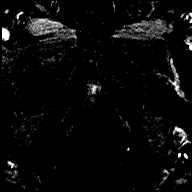
[im 73/580]
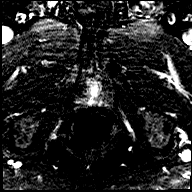
[im 109/580]
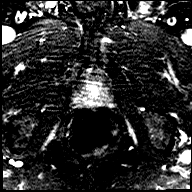
[im 145/580]
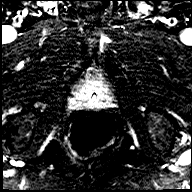
[im 181/580]
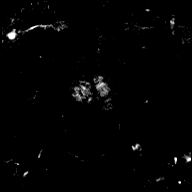
[im 218/580]
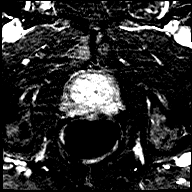
[im 254/580]
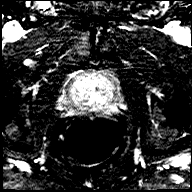
[im 290/580]
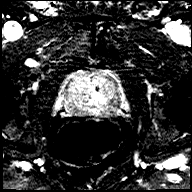
[im 326/580]
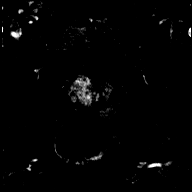
[im 362/580]
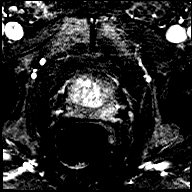
[im 399/580]
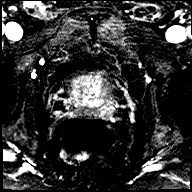
[im 435/580]
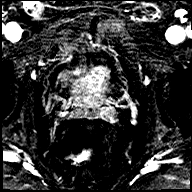
[im 471/580]
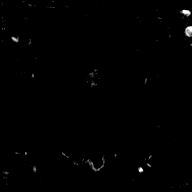
[im 507/580]
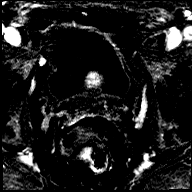
[im 543/580]
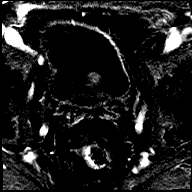
[im 580/580]
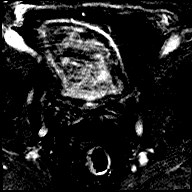

[Series 13: t1_vibe_dixon_tra_f · axial · 2.5mm · 0.91mm/px · z∈[-81,+116]mm · 2 of 80 slices shown]
[im 1/80]
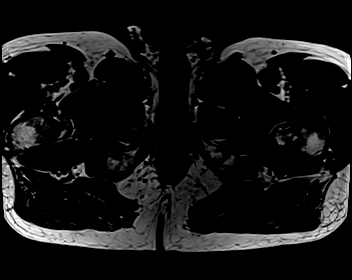
[im 80/80]
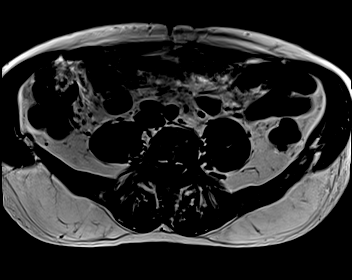

[Series 14: t1_vibe_dixon_tra_w · axial · 2.5mm · 0.91mm/px · z∈[-81,+116]mm · 2 of 80 slices shown]
[im 1/80]
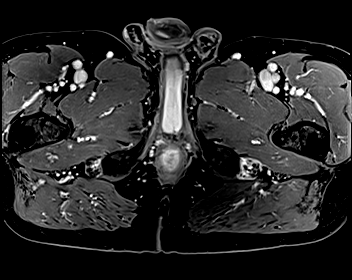
[im 80/80]
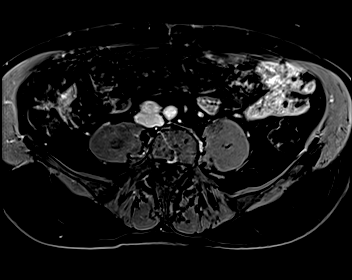

[48 of 48 positions shown; findings below may reference images not displayed]

FINDINGS: Prostate:

Region of interest # 1: PI-RADS category 4 lesion of the left
posterolateral and posteromedial peripheral zone in the mid gland
and apex with reduced T2 signal and focal early enhancement.
Potential extension of the abnormal enhancement into the left
central zone and into the left lower seminal vesicle. There is some
restriction of diffusion in this region but without the prominent
dropout of ADC map activity and accordingly the diffusion-weighted
score is 4 rather than 5. This measures 0.95 cc (1.6 by 0.6 by
cm) and is shown for example on image 15 series 8 and image 133
series 12.

Along the lower portion of the left seminal vesicle there is some
restriction of diffusion for example on images 9-10 of series 7
along with some asymmetric enhancement as on image 148 series 12.

Encapsulated nodularity in the transition zone compatible with
benign prostatic hypertrophy.

Volume: 3D volumetric analysis: Prostate volume 48.40 cc (4.8 by
by 5.4 cm).

Transcapsular spread: Not directly visualized although there is
greater than 1.5 cm of capsular abutment.

Seminal vesicle involvement: Suspicious on the left, with
enhancement and restricted diffusion in the lower portion of the
left seminal vesicle.

Neurovascular bundle involvement: Not directly identified.

Pelvic adenopathy: Absent

Bone metastasis: Nonspecific sclerosis in the left lower pubic body
and adjacent inferior pubic ramus on images 71-75 of series [DATE],
without restriction of diffusion in this vicinity. I suspect that
this may be degenerative but strictly speaking a metastatic lesion
cannot be readily excluded.

Other findings: Red marrow redistribution is present. This is
nonspecific can be associated with a variety of conditions including
chronic anemia, chronic heart failure, myelofibrosis, infiltrative
marrow disease, response to infection, and other conditions.
IMPRESSION: 1. PI-RADS category 4 lesion of the left posterolateral and
posteromedial peripheral zone in the mid gland and apex, with
potential extension into the left central zone and left seminal
vesicle as described above. Targeting data for the prostatic portion
of the lesion sent to [facility].
2. The lesion has greater than 1.5 cm of capsular abutment which can
increase risk of occult transcapsular spread.
3. Nonspecific sclerosis in the left lower pubic body, quite likely
degenerative but strictly speaking a metastatic lesion cannot be
excluded.
4. Red marrow redistribution is present. This is nonspecific can be
associated with a variety of conditions including chronic anemia,
chronic heart failure, myelofibrosis, infiltrative marrow disease,
response to infection, and other conditions.

## 2023-08-14 ENCOUNTER — Inpatient Hospital Stay (HOSPITAL_COMMUNITY)
Admission: EM | Admit: 2023-08-14 | Discharge: 2023-08-16 | DRG: 694 | Disposition: A | Discharge status: Home / Self Care | Payer: Medicare Other | Attending: Family Medicine | Admitting: Family Medicine

## 2023-08-14 ENCOUNTER — Other Ambulatory Visit: Payer: Self-pay

## 2023-08-14 ENCOUNTER — Encounter (HOSPITAL_COMMUNITY): Payer: Self-pay

## 2023-08-14 DIAGNOSIS — N201 Calculus of ureter: Secondary | ICD-10-CM

## 2023-08-14 DIAGNOSIS — N202 Calculus of kidney with calculus of ureter: Secondary | ICD-10-CM | POA: Diagnosis not present

## 2023-08-14 DIAGNOSIS — C61 Malignant neoplasm of prostate: Secondary | ICD-10-CM | POA: Diagnosis present

## 2023-08-14 DIAGNOSIS — K754 Autoimmune hepatitis: Secondary | ICD-10-CM | POA: Diagnosis present

## 2023-08-14 DIAGNOSIS — R7309 Other abnormal glucose: Secondary | ICD-10-CM

## 2023-08-14 DIAGNOSIS — N281 Cyst of kidney, acquired: Secondary | ICD-10-CM | POA: Diagnosis present

## 2023-08-14 DIAGNOSIS — Z803 Family history of malignant neoplasm of breast: Secondary | ICD-10-CM

## 2023-08-14 DIAGNOSIS — Z8042 Family history of malignant neoplasm of prostate: Secondary | ICD-10-CM

## 2023-08-14 DIAGNOSIS — Z888 Allergy status to other drugs, medicaments and biological substances status: Secondary | ICD-10-CM

## 2023-08-14 DIAGNOSIS — Z820 Family history of epilepsy and other diseases of the nervous system: Secondary | ICD-10-CM

## 2023-08-14 DIAGNOSIS — Z8661 Personal history of infections of the central nervous system: Secondary | ICD-10-CM

## 2023-08-14 DIAGNOSIS — Z8249 Family history of ischemic heart disease and other diseases of the circulatory system: Secondary | ICD-10-CM

## 2023-08-14 DIAGNOSIS — N179 Acute kidney failure, unspecified: Secondary | ICD-10-CM | POA: Diagnosis present

## 2023-08-14 DIAGNOSIS — Z79624 Long term (current) use of inhibitors of nucleotide synthesis: Secondary | ICD-10-CM

## 2023-08-14 DIAGNOSIS — N200 Calculus of kidney: Principal | ICD-10-CM

## 2023-08-14 DIAGNOSIS — E785 Hyperlipidemia, unspecified: Secondary | ICD-10-CM | POA: Diagnosis present

## 2023-08-14 DIAGNOSIS — Z8546 Personal history of malignant neoplasm of prostate: Secondary | ICD-10-CM

## 2023-08-14 LAB — URINALYSIS, ROUTINE W REFLEX MICROSCOPIC
Bilirubin Urine: NEGATIVE
Glucose, UA: NEGATIVE mg/dL
Ketones, ur: NEGATIVE mg/dL
Nitrite: NEGATIVE
Protein, ur: NEGATIVE mg/dL
RBC / HPF: >50 RBC/hpf (ref 0–5)
Specific Gravity, Urine: 1.021 (ref 1.005–1.030)
pH: 5 (ref 5.0–8.0)

## 2023-08-14 LAB — COMPREHENSIVE METABOLIC PANEL
ALT: 21 U/L (ref 0–44)
AST: 29 U/L (ref 15–41)
Albumin: 4.4 g/dL (ref 3.5–5.0)
Alkaline Phosphatase: 94 U/L (ref 38–126)
Anion gap: 11 (ref 5–15)
BUN: 30 mg/dL — ABNORMAL HIGH (ref 8–23)
CO2: 21 mmol/L — ABNORMAL LOW (ref 22–32)
Calcium: 9.2 mg/dL (ref 8.9–10.3)
Chloride: 106 mmol/L (ref 98–111)
Creatinine, Ser: 1.94 mg/dL — ABNORMAL HIGH (ref 0.61–1.24)
GFR, Estimated: 36 mL/min — ABNORMAL LOW (ref >60)
Glucose, Bld: 123 mg/dL — ABNORMAL HIGH (ref 70–99)
Potassium: 4.6 mmol/L (ref 3.5–5.1)
Sodium: 138 mmol/L (ref 135–145)
Total Bilirubin: 0.8 mg/dL (ref 0.3–1.2)
Total Protein: 7.5 g/dL (ref 6.5–8.1)

## 2023-08-14 LAB — CBC
HCT: 40.3 % (ref 39.0–52.0)
Hemoglobin: 13.6 g/dL (ref 13.0–17.0)
MCH: 30.2 pg (ref 26.0–34.0)
MCHC: 33.7 g/dL (ref 30.0–36.0)
MCV: 89.4 fL (ref 80.0–100.0)
Platelets: 194 10*3/uL (ref 150–400)
RBC: 4.51 MIL/uL (ref 4.22–5.81)
RDW: 12.8 % (ref 11.5–15.5)
WBC: 8.3 10*3/uL (ref 4.0–10.5)
nRBC: 0 % (ref 0.0–0.2)

## 2023-08-14 LAB — LIPASE, BLOOD: Lipase: 28 U/L (ref 11–51)

## 2023-08-14 NOTE — ED Triage Notes (Signed)
Pt. Arrives POV c/o abdominal pain and distension x1 day. Pt. 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 Pt. Called his GI doctor and was told to come to the ER.

## 2023-08-15 ENCOUNTER — Encounter (HOSPITAL_COMMUNITY): Payer: Self-pay | Admitting: Internal Medicine

## 2023-08-15 ENCOUNTER — Emergency Department (HOSPITAL_COMMUNITY): Payer: Medicare Other

## 2023-08-15 DIAGNOSIS — C61 Malignant neoplasm of prostate: Secondary | ICD-10-CM

## 2023-08-15 DIAGNOSIS — Z8042 Family history of malignant neoplasm of prostate: Secondary | ICD-10-CM | POA: Diagnosis not present

## 2023-08-15 DIAGNOSIS — N200 Calculus of kidney: Secondary | ICD-10-CM

## 2023-08-15 DIAGNOSIS — E785 Hyperlipidemia, unspecified: Secondary | ICD-10-CM | POA: Diagnosis present

## 2023-08-15 DIAGNOSIS — N202 Calculus of kidney with calculus of ureter: Secondary | ICD-10-CM | POA: Diagnosis present

## 2023-08-15 DIAGNOSIS — K754 Autoimmune hepatitis: Secondary | ICD-10-CM | POA: Diagnosis present

## 2023-08-15 DIAGNOSIS — Z803 Family history of malignant neoplasm of breast: Secondary | ICD-10-CM | POA: Diagnosis not present

## 2023-08-15 DIAGNOSIS — Z888 Allergy status to other drugs, medicaments and biological substances status: Secondary | ICD-10-CM | POA: Diagnosis not present

## 2023-08-15 DIAGNOSIS — Z8249 Family history of ischemic heart disease and other diseases of the circulatory system: Secondary | ICD-10-CM | POA: Diagnosis not present

## 2023-08-15 DIAGNOSIS — Z8661 Personal history of infections of the central nervous system: Secondary | ICD-10-CM | POA: Diagnosis not present

## 2023-08-15 DIAGNOSIS — Z820 Family history of epilepsy and other diseases of the nervous system: Secondary | ICD-10-CM | POA: Diagnosis not present

## 2023-08-15 DIAGNOSIS — N179 Acute kidney failure, unspecified: Secondary | ICD-10-CM | POA: Diagnosis present

## 2023-08-15 DIAGNOSIS — Z8546 Personal history of malignant neoplasm of prostate: Secondary | ICD-10-CM | POA: Diagnosis not present

## 2023-08-15 DIAGNOSIS — Z79624 Long term (current) use of inhibitors of nucleotide synthesis: Secondary | ICD-10-CM | POA: Diagnosis not present

## 2023-08-15 DIAGNOSIS — N281 Cyst of kidney, acquired: Secondary | ICD-10-CM | POA: Diagnosis present

## 2023-08-15 DIAGNOSIS — N201 Calculus of ureter: Secondary | ICD-10-CM | POA: Diagnosis present

## 2023-08-15 LAB — BASIC METABOLIC PANEL
Anion gap: 9 (ref 5–15)
BUN: 27 mg/dL — ABNORMAL HIGH (ref 8–23)
CO2: 22 mmol/L (ref 22–32)
Calcium: 8.8 mg/dL — ABNORMAL LOW (ref 8.9–10.3)
Chloride: 106 mmol/L (ref 98–111)
Creatinine, Ser: 1.79 mg/dL — ABNORMAL HIGH (ref 0.61–1.24)
GFR, Estimated: 40 mL/min — ABNORMAL LOW (ref >60)
Glucose, Bld: 128 mg/dL — ABNORMAL HIGH (ref 70–99)
Potassium: 4.2 mmol/L (ref 3.5–5.1)
Sodium: 137 mmol/L (ref 135–145)

## 2023-08-15 LAB — CBC
HCT: 37 % — ABNORMAL LOW (ref 39.0–52.0)
Hemoglobin: 12.3 g/dL — ABNORMAL LOW (ref 13.0–17.0)
MCH: 30.1 pg (ref 26.0–34.0)
MCHC: 33.2 g/dL (ref 30.0–36.0)
MCV: 90.7 fL (ref 80.0–100.0)
Platelets: 166 10*3/uL (ref 150–400)
RBC: 4.08 MIL/uL — ABNORMAL LOW (ref 4.22–5.81)
RDW: 12.7 % (ref 11.5–15.5)
WBC: 7.4 10*3/uL (ref 4.0–10.5)
nRBC: 0 % (ref 0.0–0.2)

## 2023-08-15 LAB — PROTIME-INR
INR: 1.1 (ref 0.8–1.2)
Prothrombin Time: 14 seconds (ref 11.4–15.2)

## 2023-08-15 MED ORDER — PANTOPRAZOLE SODIUM 20 MG PO TBEC
20.0000 mg | DELAYED_RELEASE_TABLET | Freq: Every day | ORAL | Status: DC
  Administered 2023-08-15 – 2023-08-16 (×2): 20 mg via ORAL
  Filled 2023-08-15 (×2): qty 1

## 2023-08-15 MED ORDER — LACTATED RINGERS IV SOLN: INTRAVENOUS | Status: DC

## 2023-08-15 MED ORDER — TAMSULOSIN HCL 0.4 MG PO CAPS
0.4000 mg | ORAL_CAPSULE | Freq: Once | ORAL | Status: AC
  Administered 2023-08-15: 0.4 mg via ORAL
  Filled 2023-08-15: qty 1

## 2023-08-15 MED ORDER — ACETAMINOPHEN 650 MG RE SUPP: 650.0000 mg | Freq: Four times a day (QID) | RECTAL | Status: DC | PRN

## 2023-08-15 MED ORDER — ACETAMINOPHEN 325 MG PO TABS
650.0000 mg | ORAL_TABLET | Freq: Four times a day (QID) | ORAL | Status: DC | PRN
  Administered 2023-08-15: 650 mg via ORAL
  Filled 2023-08-15: qty 2

## 2023-08-15 MED ORDER — HYDROMORPHONE HCL 1 MG/ML IJ SOLN: 0.5000 mg | INTRAMUSCULAR | Status: DC | PRN

## 2023-08-15 MED ORDER — ONDANSETRON HCL 4 MG PO TABS: 4.0000 mg | ORAL_TABLET | Freq: Four times a day (QID) | ORAL | Status: DC | PRN

## 2023-08-15 MED ORDER — MYCOPHENOLATE MOFETIL 250 MG PO CAPS
1000.0000 mg | ORAL_CAPSULE | Freq: Every day | ORAL | Status: DC
  Administered 2023-08-15 – 2023-08-16 (×2): 1000 mg via ORAL
  Filled 2023-08-15 (×2): qty 4

## 2023-08-15 MED ORDER — ONDANSETRON HCL 4 MG/2ML IJ SOLN: 4.0000 mg | Freq: Four times a day (QID) | INTRAMUSCULAR | Status: DC | PRN

## 2023-08-15 MED ORDER — TAMSULOSIN HCL 0.4 MG PO CAPS
0.4000 mg | ORAL_CAPSULE | Freq: Every day | ORAL | Status: DC
  Administered 2023-08-16: 0.4 mg via ORAL
  Filled 2023-08-15: qty 1

## 2023-08-15 MED ORDER — ENOXAPARIN SODIUM 40 MG/0.4ML IJ SOSY
40.0000 mg | PREFILLED_SYRINGE | INTRAMUSCULAR | Status: DC
  Administered 2023-08-15: 40 mg via SUBCUTANEOUS
  Filled 2023-08-15: qty 0.4

## 2023-08-15 NOTE — ED Provider Notes (Signed)
[place] EMERGENCY DEPARTMENT AT [facility] Provider Note   CSN: [id number] Arrival date & time: 08/14/23  2041     History  No chief complaint on file.   Blake [name] is a 72 y.o. male.  The history is provided by the patient.  He has history of hyperlipidemia, autoimmune hepatitis with cirrhosis, and comes in because of intermittent left-sided abdominal pain which started yesterday.  Pain can last several hours before resolving.  There is no associated nausea or vomiting.  He has noted that he has had decreased stool output and only passing small pieces of stool instead of having a satisfactory bowel movement.   Home Medications Prior to Admission medications   Medication Sig Start Date End Date Taking? Authorizing Provider  Calcium Carb-Cholecalciferol (CALCIUM 600/VITAMIN D PO) Take 600 mg by mouth 2 (two) times daily.    [provider]  Leuprolide Acetate, 6 Month, (LUPRON DEPOT, 62-MONTH,) 45 MG injection Inject 45 mg into the muscle every 6 (six) months. Last done 1 month to 6 weeks ago per pt on 04-23-2022    [provider]  mycophenolate (CELLCEPT) 500 MG tablet Take 1 tablet by mouth daily. 08/20/22 08/15/23  [provider]      Allergies    Azathioprine    Review of Systems   Review of Systems  All other systems reviewed and are negative.   Physical Exam Updated Vital Signs BP 132/75   Pulse 71   Temp 98 F (36.7 C) (Oral)   Resp 16   SpO2 99%  Physical Exam Vitals and nursing note reviewed.   72 year old male, resting comfortably and in no acute distress. Vital signs are normal. Oxygen saturation is 99%, which is normal. Head is normocephalic and atraumatic. PERRLA, EOMI. Oropharynx is clear. Neck is nontender and supple without adenopathy or JVD. Back is nontender and there is no CVA tenderness. Lungs are clear without rales, wheezes, or rhonchi. Chest is nontender. Heart has regular rate and rhythm without  murmur. Abdomen is soft, slightly distended with umbilical hernia present which is easily reducible.  Positive fluid wave.  There is no tenderness, even to deep palpation. Extremities have no cyanosis or edema, full range of motion is present. Skin is warm and dry without rash. Neurologic: Mental status is normal, cranial nerves are intact, moves all extremities equally.  ED Results / Procedures / Treatments   Labs (all labs ordered are listed, but only abnormal results are displayed) Labs Reviewed  COMPREHENSIVE METABOLIC PANEL - Abnormal; Notable for the following components:      Result Value   CO2 21 (*)    Glucose, Bld 123 (*)    BUN 30 (*)    Creatinine, Ser 1.94 (*)    GFR, Estimated 36 (*)    All other components within normal limits  URINALYSIS, ROUTINE W REFLEX MICROSCOPIC - Abnormal; Notable for the following components:   APPearance HAZY (*)    Hgb urine dipstick LARGE (*)    Leukocytes,Ua SMALL (*)    Bacteria, UA RARE (*)    All other components within normal limits  LIPASE, BLOOD  CBC   Radiology CT ABDOMEN PELVIS WO CONTRAST  Result Date: 08/15/2023 CLINICAL DATA:  Left lower quadrant abdominal pain EXAM: CT ABDOMEN AND PELVIS WITHOUT CONTRAST TECHNIQUE: Multidetector CT imaging of the abdomen and pelvis was performed following the standard protocol without IV contrast. RADIATION DOSE REDUCTION: This exam was performed according to the departmental dose-optimization program  which includes automated exposure control, adjustment of the mA and/or kV according to patient size and/or use of iterative reconstruction technique. COMPARISON:  PET/CT 01/07/2022 and MRI abdomen 11/07/2016 FINDINGS: Lower chest: No acute abnormality. Hepatobiliary: Hepatic cysts. No acute abnormality. No biliary dilation. Pancreas: Unremarkable. Spleen: Unremarkable. Adrenals/Urinary Tract: Normal adrenal glands. Low-attenuation lesions in the kidneys are statistically likely to represent cysts. No  follow-up is required. 3 mm stone in the distal left ureter with mild associated hydroureteronephrosis. Punctate nonobstructing left nephrolithiasis. Asymmetric left perinephric stranding. Unremarkable bladder. Stomach/Bowel: Normal caliber large and small bowel. No bowel wall thickening. Colonic diverticulosis without diverticulitis. Stomach and appendix are within normal limits. Vascular/Lymphatic: Aortic atherosclerosis. No enlarged abdominal or pelvic lymph nodes. Reproductive: Metallic seeds in the prostate. Other: No free intraperitoneal fluid or air. Fat containing umbilical hernia. Musculoskeletal: No acute fracture or destructive osseous lesion. Similar sclerosis about the left pubic body at the pubic symphysis. Left femoral head AVN. IMPRESSION: 1. 3 mm stone in the distal left ureter with mild associated hydroureteronephrosis. 2. Left femoral head AVN. Aortic Atherosclerosis (ICD10-I70.0). Electronically Signed   By: Minerva Fester M.D.   On: 08/15/2023 00:58    Procedures Procedures    Medications Ordered in ED Medications - No data to display  ED Course/ Medical Decision Making/ A&P                                 Medical Decision Making Amount and/or Complexity of Data Reviewed Radiology: ordered.  Risk Prescription drug management. Decision regarding hospitalization.   Abdominal pain of uncertain cause.  Waxing and waning pain is atypical for spontaneous bacterial peritonitis.  Consider diverticulitis, urolithiasis, urinary tract infection, cramping from constipation.  I have reviewed and interpreted his laboratory tests, and my interpretation is acute kidney injury with creatinine 1.94 compared with 1.10 on 04/25/2022, elevated random glucose level, normal CBC and platelets, microscopic hematuria with mild pyuria but only rare bacteria.  I doubt that urine represents UTI.  I have reviewed his past records, and in care everywhere I note creatinine 1.06 on 07/21/2023, confirming  that this is an acute kidney injury.  I have ordered CT scan of abdomen and pelvis.  He will need to be admitted for workup and management of acute kidney injury.  CT scan shows 3 mm calculus in the distal left ureter with mild hydronephrosis.  This is not sufficient to account for acute kidney injury, but it does account for his abdominal pain.  I have ordered a dose of tamsulosin.  I have discussed case with Dr. Julian Reil of [facility], who agrees to admit the patient.  I have consulted urology for management of ureteral calculus.  Final Clinical Impression(s) / ED Diagnoses Final diagnoses:  Acute kidney injury (nontraumatic) (HCC)  Ureterolithiasis  Elevated random blood glucose level    Rx / DC Orders ED Discharge Orders     None         Dione Booze, MD 08/15/23 [phone]

## 2023-08-15 NOTE — Plan of Care (Signed)
  Problem: Activity: Goal: Risk for activity intolerance will decrease Outcome: Progressing   Problem: Nutrition: Goal: Adequate nutrition will be maintained Outcome: Progressing   Problem: Coping: Goal: Level of anxiety will decrease Outcome: Progressing   Problem: Elimination: Goal: Will not experience complications related to bowel motility Outcome: Progressing   Problem: Elimination: Goal: Will not experience complications related to urinary retention Outcome: Progressing   Problem: Pain Managment: Goal: General experience of comfort will improve Outcome: Progressing   Problem: Safety: Goal: Ability to remain free from injury will improve Outcome: Progressing   

## 2023-08-15 NOTE — Assessment & Plan Note (Signed)
Continue Cellcept but check level given AKI.

## 2023-08-15 NOTE — Assessment & Plan Note (Addendum)
Starting flomax for the 3mm stone Dilaudid PRN pain But given AKI without other apparent cause, may need intervention on this EDP calling urology to discuss.

## 2023-08-15 NOTE — Assessment & Plan Note (Addendum)
Creat 1.9 today up from 1.0 on 7/23 (care everywhere, [facility] system). Only apparent cause seems to be the small 3mm obstructing L ureteral stone. Not sure why R kidney doesn't seem to be functioning better? IVF NPO Strict intake and output Avoid nephrotoxic meds EDP calling urology to see if he needs intervention for stone given AKI without other apparent cause.

## 2023-08-15 NOTE — H&P (Signed)
History and Physical    Patient: Blake [name] ZOX:[id number] DOB: Nov 11, 1951 DOA: 08/14/2023 DOS: the patient was seen and examined on 08/15/2023 PCP: Mattie Marlin, DO  Patient coming from: Home  Chief Complaint: Abd pain  HPI: Blake [name] is a 72 y.o. male with medical history significant of autoimmune hepatitis on [name], Blake [place].  Pt in to ED with c/o intermittent L sided abd pain, onset yesterday.  Can last several hours before resolving.  No N/V, no fever.  Has been constipated.   Review of Systems: As mentioned in the history of present illness. All other systems reviewed and are negative. 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   prolonged hospitilization at [facility] for   Elevated PSA 08/2021   History of Paroxysmal  atrial flutter 2018   Meningitis    Blake cancer [facility])    Past Surgical History:  Procedure Laterality Date   colonscopy  03/2021   benign polyps removed   GOLD SEED IMPLANT N/A 04/25/2022   Procedure: GOLD SEED IMPLANT;  Surgeon: Crist Fat, MD;  Location: [facility];  Service: Urology;  Laterality: N/A;   HYDROCELE EXCISION  12/30/1971   IR PARACENTESIS  03/27/2017   SPACE OAR INSTILLATION N/A 04/25/2022   Procedure: SPACE OAR INSTILLATION;  Surgeon: Crist Fat, MD;  Location: [facility];  Service: Urology;  Laterality: N/A;   Social History:  reports that he has never smoked. He has never used smokeless tobacco. He reports current alcohol use. He reports that he does not use drugs.  Allergies  Allergen Reactions   Azathioprine     Other reaction(s): Other (See Comments) Pt had vomiting and diarrheal episodes.    Family History  Problem Relation Age of Onset   Parkinson's disease Mother    Heart disease Father    Blake cancer Father    Alzheimer's disease Father     Breast cancer Sister    Ulcerative colitis Sister    Liver disease Sister    Hypertension Brother     Prior to Admission medications   Medication Sig Start Date End Date Taking? Authorizing Provider  Calcium Carb-Cholecalciferol (CALCIUM 600/VITAMIN D PO) Take 600 mg by mouth daily.   Yes [provider]  Leuprolide Acetate, 6 Month, (LUPRON DEPOT, 64-MONTH,) 45 MG injection Inject 45 mg into the muscle every 6 (six) months. Last done 1 month to 6 weeks ago per pt on 04-23-2022   Yes [provider]  mycophenolate ([name]) 500 MG tablet Take 1,000 mg by mouth daily. 08/20/22 08/15/23 Yes [provider]  omeprazole (PRILOSEC) 10 MG capsule Take 10 mg by mouth daily. 07/22/23 07/21/24 Yes [provider]    Physical Exam: Vitals:   08/14/23 2118 08/15/23 0057  BP: 132/75 128/79  Pulse: 71 64  Resp: 16 16  Temp: 98 F (36.7 C) 98 F (36.7 C)  TempSrc: Oral   SpO2: 99% 97%   Constitutional: NAD, calm, comfortable Respiratory: clear to auscultation bilaterally, no wheezing, no crackles. Normal respiratory effort. No accessory muscle use.  Cardiovascular: Regular rate and rhythm, no murmurs / rubs / gallops. No extremity edema. 2+ pedal pulses. No carotid bruits.  Abdomen: no tenderness, no masses palpated. No hepatosplenomegaly. Bowel sounds positive.  Neurologic: CN 2-12 grossly intact. Sensation intact, DTR normal. Strength 5/5 in all 4.  Psychiatric: Normal judgment and insight.  Alert and oriented x 3. Normal mood.   Data Reviewed:    Labs on Admission: I have personally reviewed following labs and imaging studies  CBC: Recent Labs  Lab 08/14/23 2149  WBC 8.3  HGB 13.6  HCT 40.3  MCV 89.4  PLT 194   Basic Metabolic Panel: Recent Labs  Lab 08/14/23 2149  NA 138  K 4.6  CL 106  CO2 21*  GLUCOSE 123*  BUN 30*  CREATININE 1.94*  CALCIUM 9.2   GFR: CrCl cannot be calculated (Unknown ideal weight.). Liver Function Tests: Recent  Labs  Lab 08/14/23 2149  AST 29  ALT 21  ALKPHOS 94  BILITOT 0.8  PROT 7.5  ALBUMIN 4.4   Recent Labs  Lab 08/14/23 2149  LIPASE 28   No results for input(s): "AMMONIA" in the last 168 hours. Coagulation Profile: No results for input(s): "INR", "PROTIME" in the last 168 hours. Cardiac Enzymes: No results for input(s): "CKTOTAL", "CKMB", "CKMBINDEX", "TROPONINI" in the last 168 hours. BNP (last 3 results) No results for input(s): "PROBNP" in the last 8760 hours. HbA1C: No results for input(s): "HGBA1C" in the last 72 hours. CBG: No results for input(s): "GLUCAP" in the last 168 hours. Lipid Profile: No results for input(s): "CHOL", "HDL", "LDLCALC", "TRIG", "CHOLHDL", "LDLDIRECT" in the last 72 hours. Thyroid Function Tests: No results for input(s): "TSH", "T4TOTAL", "FREET4", "T3FREE", "THYROIDAB" in the last 72 hours. Anemia Panel: No results for input(s): "VITAMINB12", "FOLATE", "FERRITIN", "TIBC", "IRON", "RETICCTPCT" in the last 72 hours. Urine analysis:    Component Value Date/Time   COLORURINE YELLOW 08/14/2023 2200   APPEARANCEUR HAZY (A) 08/14/2023 2200   LABSPEC 1.021 08/14/2023 2200   PHURINE 5.0 08/14/2023 2200   GLUCOSEU NEGATIVE 08/14/2023 2200   HGBUR LARGE (A) 08/14/2023 2200   BILIRUBINUR NEGATIVE 08/14/2023 2200   KETONESUR NEGATIVE 08/14/2023 2200   PROTEINUR NEGATIVE 08/14/2023 2200   UROBILINOGEN 0.2 09/09/2015 1400   NITRITE NEGATIVE 08/14/2023 2200   LEUKOCYTESUR SMALL (A) 08/14/2023 2200    Radiological Exams on Admission: CT ABDOMEN PELVIS WO CONTRAST  Result Date: 08/15/2023 CLINICAL DATA:  Left lower quadrant abdominal pain EXAM: CT ABDOMEN AND PELVIS WITHOUT CONTRAST TECHNIQUE: Multidetector CT imaging of the abdomen and pelvis was performed following the standard protocol without IV contrast. RADIATION DOSE REDUCTION: This exam was performed according to the departmental dose-optimization program which includes automated exposure  control, adjustment of the mA and/or kV according to patient size and/or use of iterative reconstruction technique. COMPARISON:  PET/CT 01/07/2022 and MRI abdomen 11/07/2016 FINDINGS: Lower chest: No acute abnormality. Hepatobiliary: Hepatic cysts. No acute abnormality. No biliary dilation. Pancreas: Unremarkable. Spleen: Unremarkable. Adrenals/Urinary Tract: Normal adrenal glands. Low-attenuation lesions in the kidneys are statistically likely to represent cysts. No follow-up is required. 3 mm stone in the distal left ureter with mild associated hydroureteronephrosis. Punctate nonobstructing left nephrolithiasis. Asymmetric left perinephric stranding. Unremarkable bladder. Stomach/Bowel: Normal caliber large and small bowel. No bowel wall thickening. Colonic diverticulosis without diverticulitis. Stomach and appendix are within normal limits. Vascular/Lymphatic: Aortic atherosclerosis. No enlarged abdominal or pelvic lymph nodes. Reproductive: Metallic seeds in the Blake. Other: No free intraperitoneal fluid or air. Fat containing umbilical hernia. Musculoskeletal: No acute fracture or destructive osseous lesion. Similar sclerosis about the left pubic body at the pubic symphysis. Left femoral head AVN. IMPRESSION: 1. 3 mm stone in the distal left ureter with mild associated hydroureteronephrosis. 2. Left femoral head AVN. Aortic Atherosclerosis (ICD10-I70.0). Electronically Signed   By: Angelique Holm.D.  On: 08/15/2023 00:58    EKG: Independently reviewed.   Assessment and Plan: * AKI (acute kidney injury) (HCC) Creat 1.9 today up from 1.0 on 7/23 (care everywhere, [facility] system). Only apparent cause seems to be the small 3mm obstructing L ureteral stone. Not sure why R kidney doesn't seem to be functioning better? IVF NPO Strict intake and output Avoid nephrotoxic meds EDP calling urology to see if he needs intervention for stone given AKI without other apparent  cause.  Nephrolithiasis Starting flomax for the 3mm stone Dilaudid PRN pain But given AKI without other apparent cause, may need intervention on this EDP calling urology to discuss.  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      Advance Care Planning:   Code Status: Full Code  Consults: EDP Calling urology as above  Family Communication: No family in room  Severity of Illness: The appropriate patient status for this patient is OBSERVATION. Observation status is judged to be reasonable and necessary in order to provide the required intensity of service to ensure the patient's safety. The patient's presenting symptoms, physical exam findings, and initial radiographic and laboratory data in the context of their medical condition is felt to place them at decreased risk for further clinical deterioration. Furthermore, it is anticipated that the patient will be medically stable for discharge from the hospital within 2 midnights of admission.   Author: Hillary Bow., DO 08/15/2023 1:49 AM  For on call review [email or web address].

## 2023-08-15 NOTE — Progress Notes (Signed)
Patient seen after midnight by admitting physician see his H&P and see consult note from Dr. 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 Mena Goes Admitted with flank pain nausea AKI 3 mm distal ureteric stone and creatinine of 1.9  I am seeing him at the bedside he has a friend present he is feels well he is not having any chest pain fever He is eating He is been informed that his creatinine is improving He feels better He is eating   Awake coherent pleasant no distress No flank pain Abdomen soft   Operative versus expulsive therapy per Dr. Annabell Howells We will see what his creatinine looks like tomorrow  Expect will be here over the weekend  No charge  Pleas Koch, MD [facility] 3:53 PM

## 2023-08-15 NOTE — Consult Note (Signed)
Subjective:    Consult requested by Pleas Koch MD  Blake [name] is a 72 yo male who had the onset about 30 hours ago of severe left flank pain without nausea, voiding symptoms or hematuria.   He was found to have a 3mm left distal ureteral stone with obstruction on CT.  He has AKI with a Cr. 1.94 on admission which is up from 1.1 at baseline.  It is down to 1.79 this morning.  He has a normal appearing right kidney apart from renal cysts on the CT.  His pain is well controlled today.  He is afebrile.  His UA had >50 RBC's and 21-50 WBC with rare bacteria.   He has a history of Gleason 9 prostate cancer that was treated with EXRT last year and he remains on Eligard with his last dose in 3/24 and an undetectible PSA at that time.  Dr. Mena Goes is his primary urologist.  ROS:  Review of Systems  Gastrointestinal:  Positive for abdominal pain. Negative for nausea and vomiting.  Genitourinary:  Positive for flank pain, frequency and urgency.  All other systems reviewed and are negative.   Allergies  Allergen Reactions   Azathioprine     Other reaction(s): Other (See Comments) Pt had vomiting and diarrheal episodes.    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   prolonged hospitilization at [facility] for   Elevated PSA 08/2021   History of Paroxysmal  atrial flutter 2018   Meningitis    Prostate cancer [facility])     Past Surgical History:  Procedure Laterality Date   colonscopy  03/2021   benign polyps removed   GOLD SEED IMPLANT N/A 04/25/2022   Procedure: GOLD SEED IMPLANT;  Surgeon: Crist Fat, MD;  Location: [facility];  Service: Urology;  Laterality: N/A;   HYDROCELE EXCISION  12/30/1971   IR PARACENTESIS  03/27/2017   SPACE OAR INSTILLATION N/A 04/25/2022   Procedure: SPACE OAR INSTILLATION;  Surgeon: Crist Fat, MD;  Location: [facility];  Service: Urology;  Laterality: N/A;    Social History   Socioeconomic History   Marital status: Single    Spouse name: Not on file   Number of children: Not on file   Years of education: Not on file   Highest education level: Not on file  Occupational History   Occupation: Massage Therapist  Tobacco Use   Smoking status: Never   Smokeless tobacco: Never  Vaping Use   Vaping status: Never Used  Substance and Sexual Activity   Alcohol use: Yes    Comment: rare   Drug use: Never   Sexual activity: Not on file  Other Topics Concern   Not on file  Social History Narrative   Not on file   Social Determinants of Health   Financial Resource Strain: Not on file  Food Insecurity: No Food Insecurity (08/15/2023)   Hunger Vital Sign    Worried About Running Out of Food in the Last Year: Never true    Ran Out of Food in the Last Year: Never true  Transportation Needs: No Transportation Needs (08/15/2023)   PRAPARE - Administrator, Civil Service (Medical): No    Lack of Transportation (Non-Medical): No  Physical Activity: Not on file  Stress: Not on file  Social Connections: Not on file  Intimate Partner Violence: Not At Risk (08/15/2023)  Humiliation, Afraid, Rape, and Kick questionnaire    Fear of Current or Ex-Partner: No    Emotionally Abused: No    Physically Abused: No    Sexually Abused: No    Family History  Problem Relation Age of Onset   Parkinson's disease Mother    Heart disease Father    Prostate cancer Father    Alzheimer's disease Father    Breast cancer Sister    Ulcerative colitis Sister    Liver disease Sister    Hypertension Brother     Anti-infectives: Anti-infectives (From admission, onward)    None       Current Facility-Administered Medications  Medication Dose Route Frequency Provider Last Rate Last Admin   acetaminophen (TYLENOL) tablet 650 mg  650 mg Oral Q6H PRN Hillary Bow, DO       Or    acetaminophen (TYLENOL) suppository 650 mg  650 mg Rectal Q6H PRN Hillary Bow, DO       enoxaparin (LOVENOX) injection 40 mg  40 mg Subcutaneous Q24H Julian Reil, Jared M, DO       HYDROmorphone (DILAUDID) injection 0.5-1 mg  0.5-1 mg Intravenous Q2H PRN Lyda Perone M, DO       lactated ringers infusion   Intravenous Continuous Hillary Bow, DO 125 mL/hr at 08/15/23 0600 Infusion Verify at 08/15/23 0600   mycophenolate (CELLCEPT) capsule 1,000 mg  1,000 mg Oral Daily Lyda Perone M, DO       ondansetron [facility]) tablet 4 mg  4 mg Oral Q6H PRN Hillary Bow, DO       Or   ondansetron [facility]) injection 4 mg  4 mg Intravenous Q6H PRN Hillary Bow, DO       pantoprazole (PROTONIX) EC tablet 20 mg  20 mg Oral Daily Lyda Perone M, DO       [START ON 08/16/2023] tamsulosin (FLOMAX) capsule 0.4 mg  0.4 mg Oral Daily Julian Reil, Jared M, DO         Objective: Vital signs in last 24 hours: BP 122/68 (BP Location: Left Arm)   Pulse 62   Temp 97.9 F (36.6 C) (Oral)   Resp 20   Ht 5\' 8"  (1.727 m)   Wt 75 kg   SpO2 98%   BMI 25.14 kg/m   Intake/Output from previous day: 08/16 0701 - 08/17 0700 In: 508 [I.V.:508] Out: -  Intake/Output this shift: No intake/output data recorded.   Physical Exam Vitals reviewed.  Constitutional:      Appearance: Normal appearance.  Cardiovascular:     Rate and Rhythm: Normal rate and regular rhythm.     Heart sounds: Normal heart sounds.  Pulmonary:     Effort: Pulmonary effort is normal. No respiratory distress.     Breath sounds: Normal breath sounds.  Abdominal:     General: Abdomen is flat.     Palpations: Abdomen is soft.     Tenderness: There is abdominal tenderness (very minimal in the LLQ).  Musculoskeletal:        General: No swelling or tenderness. Normal range of motion.  Skin:    General: Skin is warm and dry.  Neurological:     General: No focal deficit present.     Mental Status: He is alert and oriented to person,  place, and time.  Psychiatric:        Mood and Affect: Mood normal.        Behavior: Behavior normal.     Lab Results:  Results for orders placed or performed during the hospital encounter of 08/14/23 (from the past 24 hour(s))  Lipase, blood     Status: None   Collection Time: 08/14/23  9:49 PM  Result Value Ref Range   Lipase 28 11 - 51 U/L  Comprehensive metabolic panel     Status: Abnormal   Collection Time: 08/14/23  9:49 PM  Result Value Ref Range   Sodium 138 135 - 145 mmol/L   Potassium 4.6 3.5 - 5.1 mmol/L   Chloride 106 98 - 111 mmol/L   CO2 21 (L) 22 - 32 mmol/L   Glucose, Bld 123 (H) 70 - 99 mg/dL   BUN 30 (H) 8 - 23 mg/dL   Creatinine, Ser 4.69 (H) 0.61 - 1.24 mg/dL   Calcium 9.2 8.9 - 62.9 mg/dL   Total Protein 7.5 6.5 - 8.1 g/dL   Albumin 4.4 3.5 - 5.0 g/dL   AST 29 15 - 41 U/L   ALT 21 0 - 44 U/L   Alkaline Phosphatase 94 38 - 126 U/L   Total Bilirubin 0.8 0.3 - 1.2 mg/dL   GFR, Estimated 36 (L) >60 mL/min   Anion gap 11 5 - 15  CBC     Status: None   Collection Time: 08/14/23  9:49 PM  Result Value Ref Range   WBC 8.3 4.0 - 10.5 K/uL   RBC 4.51 4.22 - 5.81 MIL/uL   Hemoglobin 13.6 13.0 - 17.0 g/dL   HCT 52.8 41.3 - 24.4 %   MCV 89.4 80.0 - 100.0 fL   MCH 30.2 26.0 - 34.0 pg   MCHC 33.7 30.0 - 36.0 g/dL   RDW 01.0 27.2 - 53.6 %   Platelets 194 150 - 400 K/uL   nRBC 0.0 0.0 - 0.2 %  Urinalysis, Routine w reflex microscopic -Urine, Clean Catch     Status: Abnormal   Collection Time: 08/14/23 10:00 PM  Result Value Ref Range   Color, Urine YELLOW YELLOW   APPearance HAZY (A) CLEAR   Specific Gravity, Urine 1.021 1.005 - 1.030   pH 5.0 5.0 - 8.0   Glucose, UA NEGATIVE NEGATIVE mg/dL   Hgb urine dipstick LARGE (A) NEGATIVE   Bilirubin Urine NEGATIVE NEGATIVE   Ketones, ur NEGATIVE NEGATIVE mg/dL   Protein, ur NEGATIVE NEGATIVE mg/dL   Nitrite NEGATIVE NEGATIVE   Leukocytes,Ua SMALL (A) NEGATIVE   RBC / HPF >50 0 - 5 RBC/hpf   WBC, UA 21-50 0 - 5  WBC/hpf   Bacteria, UA RARE (A) NONE SEEN   Squamous Epithelial / HPF 0-5 0 - 5 /HPF   Mucus PRESENT   Basic metabolic panel     Status: Abnormal   Collection Time: 08/15/23  3:27 AM  Result Value Ref Range   Sodium 137 135 - 145 mmol/L   Potassium 4.2 3.5 - 5.1 mmol/L   Chloride 106 98 - 111 mmol/L   CO2 22 22 - 32 mmol/L   Glucose, Bld 128 (H) 70 - 99 mg/dL   BUN 27 (H) 8 - 23 mg/dL   Creatinine, Ser 6.44 (H) 0.61 - 1.24 mg/dL   Calcium 8.8 (L) 8.9 - 10.3 mg/dL   GFR, Estimated 40 (L) >60 mL/min   Anion gap 9 5 - 15  CBC     Status: Abnormal   Collection Time: 08/15/23  3:27 AM  Result Value Ref Range   WBC 7.4 4.0 - 10.5 K/uL   RBC 4.08 (L) 4.22 - 5.81 MIL/uL  Hemoglobin 12.3 (L) 13.0 - 17.0 g/dL   HCT 56.2 (L) 13.0 - 86.5 %   MCV 90.7 80.0 - 100.0 fL   MCH 30.1 26.0 - 34.0 pg   MCHC 33.2 30.0 - 36.0 g/dL   RDW 78.4 69.6 - 29.5 %   Platelets 166 150 - 400 K/uL   nRBC 0.0 0.0 - 0.2 %  Protime-INR     Status: None   Collection Time: 08/15/23  3:27 AM  Result Value Ref Range   Prothrombin Time 14.0 11.4 - 15.2 seconds   INR 1.1 0.8 - 1.2    BMET Recent Labs    08/14/23 2149 08/15/23 0327  NA 138 137  K 4.6 4.2  CL 106 106  CO2 21* 22  GLUCOSE 123* 128*  BUN 30* 27*  CREATININE 1.94* 1.79*  CALCIUM 9.2 8.8*   PT/INR Recent Labs    08/15/23 0327  LABPROT 14.0  INR 1.1   ABG No results for input(s): "PHART", "HCO3" in the last 72 hours.  Invalid input(s): "PCO2", "PO2"  Studies/Results: CT ABDOMEN PELVIS WO CONTRAST  Result Date: 08/15/2023 CLINICAL DATA:  Left lower quadrant abdominal pain EXAM: CT ABDOMEN AND PELVIS WITHOUT CONTRAST TECHNIQUE: Multidetector CT imaging of the abdomen and pelvis was performed following the standard protocol without IV contrast. RADIATION DOSE REDUCTION: This exam was performed according to the departmental dose-optimization program which includes automated exposure control, adjustment of the mA and/or kV according to  patient size and/or use of iterative reconstruction technique. COMPARISON:  PET/CT 01/07/2022 and MRI abdomen 11/07/2016 FINDINGS: Lower chest: No acute abnormality. Hepatobiliary: Hepatic cysts. No acute abnormality. No biliary dilation. Pancreas: Unremarkable. Spleen: Unremarkable. Adrenals/Urinary Tract: Normal adrenal glands. Low-attenuation lesions in the kidneys are statistically likely to represent cysts. No follow-up is required. 3 mm stone in the distal left ureter with mild associated hydroureteronephrosis. Punctate nonobstructing left nephrolithiasis. Asymmetric left perinephric stranding. Unremarkable bladder. Stomach/Bowel: Normal caliber large and small bowel. No bowel wall thickening. Colonic diverticulosis without diverticulitis. Stomach and appendix are within normal limits. Vascular/Lymphatic: Aortic atherosclerosis. No enlarged abdominal or pelvic lymph nodes. Reproductive: Metallic seeds in the prostate. Other: No free intraperitoneal fluid or air. Fat containing umbilical hernia. Musculoskeletal: No acute fracture or destructive osseous lesion. Similar sclerosis about the left pubic body at the pubic symphysis. Left femoral head AVN. IMPRESSION: 1. 3 mm stone in the distal left ureter with mild associated hydroureteronephrosis. 2. Left femoral head AVN. Aortic Atherosclerosis (ICD10-I70.0). Electronically Signed   By: Minerva Fester M.D.   On: 08/15/2023 00:58     Assessment/Plan: 3mm left distal stone with AKI.   His pain is well controlled today and his Cr is down to 1.79.   I discussed continued medical expulsive therapy with tamsulosin and ureteroscopy.  He would like to continue MET for now since his pain is controlled and his Cr is down.  Continue tamsulosin and strain all urine.  Repeat Cr in AM and keep NPO post MN.  History of prostate cancer.  He has a history of high grade prostate cancer that was treated with EXRT and LT-ADT.  He last received Eligard in 3/24 and his PSA has  been undetectible.           No follow-ups on file.    CC: Dr. Pleas Koch and Dr. Karma Greaser.      Bjorn Pippin 08/15/2023

## 2023-08-15 NOTE — ED Notes (Signed)
ED TO INPATIENT HANDOFF REPORT  Name/Age/Gender Blake [name] 72 y.o. male  Code Status    Code Status Orders  (From admission, onward)           Start     Ordered   08/15/23 0149  Full code  Continuous       Question:  By:  Answer:  Default: patient does not have capacity for decision making, no surrogate or prior directive available   08/15/23 0148           Code Status History     This patient has a current code status but no historical code status.       Home/SNF/Other Home  Chief Complaint AKI (acute kidney injury) (HCC) [N17.9]  Level of Care/Admitting Diagnosis ED Disposition     ED Disposition  Admit   Condition  --   Comment  Hospital Area: [facility] [[id number]]  Level of Care: Med-Surg [16]  May place patient in observation at [facility] or [facility] if equivalent level of care is available:: No  Covid Evaluation: Asymptomatic - no recent exposure (last 10 days) testing not required  Diagnosis: AKI (acute kidney injury) [facility]) [[id number]]  Admitting Physician: Hillary Bow [phone]  Attending Physician: Hillary Bow [phone]          Medical History 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    IV Location/Drains/Wounds Patient Lines/Drains/Airways Status     Active Line/Drains/Airways     Name Placement date Placement time Site Days   Peripheral IV Left Antecubital --  --  Antecubital  --   Peripheral IV 08/15/23 Right Antecubital 08/15/23  --  Antecubital  less than 1   Incision (Closed) 04/25/22  Perineum Other (Comment) 04/25/22  0701  -- 477            Labs/Imaging Results for orders placed or performed during the hospital encounter of 08/14/23 (from the past 48 hour(s))  Lipase, blood     Status: None   Collection Time: 08/14/23  9:49 PM  Result Value Ref Range   Lipase 28 11 - 51 U/L    Comment: Performed at [facility], [address], [place], [place] [address]  Comprehensive metabolic panel     Status: Abnormal   Collection Time: 08/14/23  9:49 PM  Result Value Ref Range   Sodium 138 135 - 145 mmol/L   Potassium 4.6 3.5 - 5.1 mmol/L   Chloride 106 98 - 111 mmol/L   CO2 21 (L) 22 - 32 mmol/L   Glucose, Bld 123 (H) 70 - 99 mg/dL    Comment: Glucose reference range applies only to samples taken after fasting for at least 8 hours.   BUN 30 (H) 8 - 23 mg/dL   Creatinine, Ser 4.13 (H) 0.61 - 1.24 mg/dL   Calcium 9.2 8.9 - 24.4 mg/dL   Total Protein 7.5 6.5 - 8.1 g/dL   Albumin 4.4 3.5 - 5.0 g/dL   AST 29 15 - 41 U/L   ALT 21 0 -  44 U/L   Alkaline Phosphatase 94 38 - 126 U/L   Total Bilirubin 0.8 0.3 - 1.2 mg/dL   GFR, Estimated 36 (L) >60 mL/min    Comment: (NOTE) Calculated using the CKD-EPI Creatinine Equation (2021)    Anion gap 11 5 - 15    Comment: Performed at [facility], [address], [place], [place] [address]  CBC     Status: None   Collection Time: 08/14/23  9:49 PM  Result Value Ref Range   WBC 8.3 4.0 - 10.5 K/uL   RBC 4.51 4.22 - 5.81 MIL/uL   Hemoglobin 13.6 13.0 - 17.0 g/dL   HCT 41.3 24.4 - 01.0 %   MCV 89.4 80.0 - 100.0 fL   MCH 30.2 26.0 - 34.0 pg   MCHC 33.7 30.0 - 36.0 g/dL   RDW 27.2 53.6 - 64.4 %   Platelets 194 150 - 400 K/uL   nRBC 0.0 0.0 - 0.2 %    Comment: Performed at [facility], [address] [place], [place] [address]  Urinalysis, Routine w reflex microscopic -Urine, Clean Catch     Status: Abnormal   Collection Time: 08/14/23 10:00 PM  Result Value Ref Range    Color, Urine YELLOW YELLOW   APPearance HAZY (A) CLEAR   Specific Gravity, Urine 1.021 1.005 - 1.030   pH 5.0 5.0 - 8.0   Glucose, UA NEGATIVE NEGATIVE mg/dL   Hgb urine dipstick LARGE (A) NEGATIVE   Bilirubin Urine NEGATIVE NEGATIVE   Ketones, ur NEGATIVE NEGATIVE mg/dL   Protein, ur NEGATIVE NEGATIVE mg/dL   Nitrite NEGATIVE NEGATIVE   Leukocytes,Ua SMALL (A) NEGATIVE   RBC / HPF >50 0 - 5 RBC/hpf   WBC, UA 21-50 0 - 5 WBC/hpf   Bacteria, UA RARE (A) NONE SEEN   Squamous Epithelial / HPF 0-5 0 - 5 /HPF   Mucus PRESENT     Comment: Performed at [facility], [address] [place], [place] [address]   CT ABDOMEN PELVIS WO CONTRAST  Result Date: 08/15/2023 CLINICAL DATA:  Left lower quadrant abdominal pain EXAM: CT ABDOMEN AND PELVIS WITHOUT CONTRAST TECHNIQUE: Multidetector CT imaging of the abdomen and pelvis was performed following the standard protocol without IV contrast. RADIATION DOSE REDUCTION: This exam was performed according to the departmental dose-optimization program which includes automated exposure control, adjustment of the mA and/or kV according to patient size and/or use of iterative reconstruction technique. COMPARISON:  PET/CT 01/07/2022 and MRI abdomen 11/07/2016 FINDINGS: Lower chest: No acute abnormality. Hepatobiliary: Hepatic cysts. No acute abnormality. No biliary dilation. Pancreas: Unremarkable. Spleen: Unremarkable. Adrenals/Urinary Tract: Normal adrenal glands. Low-attenuation lesions in the kidneys are statistically likely to represent cysts. No follow-up is required. 3 mm stone in the distal left ureter with mild associated hydroureteronephrosis. Punctate nonobstructing left nephrolithiasis. Asymmetric left perinephric stranding. Unremarkable bladder. Stomach/Bowel: Normal caliber large and small bowel. No bowel wall thickening. Colonic diverticulosis without diverticulitis. Stomach and appendix are within normal limits. Vascular/Lymphatic:  Aortic atherosclerosis. No enlarged abdominal or pelvic lymph nodes. Reproductive: Metallic seeds in the prostate. Other: No free intraperitoneal fluid or air. Fat containing umbilical hernia. Musculoskeletal: No acute fracture or destructive osseous lesion. Similar sclerosis about the left pubic body at the pubic symphysis. Left femoral head AVN. IMPRESSION: 1. 3 mm stone in the distal left ureter with mild associated hydroureteronephrosis. 2. Left femoral head AVN. Aortic Atherosclerosis (ICD10-I70.0). Electronically Signed   By: Minerva Fester M.D.   On:  08/15/2023 00:58    Pending Labs Unresulted Labs (From admission, onward)     Start     Ordered   08/15/23 0500  Basic metabolic panel  Daily,   R      08/15/23 0135   08/15/23 0500  CBC  Tomorrow morning,   R        08/15/23 0148   08/15/23 0500  Protime-INR  Tomorrow morning,   R        08/15/23 0153   08/15/23 0220  Mycophenolic Acid (CellCept)  Once,   R        08/15/23 0220            Vitals/Pain Today's Vitals   08/14/23 2114 08/14/23 2118 08/15/23 0057  BP:  132/75 128/79  Pulse:  71 64  Resp:  16 16  Temp:  98 F (36.7 C) 98 F (36.7 C)  TempSrc:  Oral   SpO2:  99% 97%  PainSc: 6       Isolation Precautions No active isolations  Medications Medications  lactated ringers infusion ( Intravenous New Bag/Given 08/15/23 0154)  pantoprazole (PROTONIX) EC tablet 20 mg (has no administration in time range)  mycophenolate (CELLCEPT) capsule 1,000 mg (has no administration in time range)  enoxaparin (LOVENOX) injection 40 mg (has no administration in time range)  acetaminophen (TYLENOL) tablet 650 mg (has no administration in time range)    Or  acetaminophen (TYLENOL) suppository 650 mg (has no administration in time range)  ondansetron (ZOFRAN) tablet 4 mg (has no administration in time range)    Or  ondansetron (ZOFRAN) injection 4 mg (has no administration in time range)  HYDROmorphone (DILAUDID) injection 0.5-1  mg (has no administration in time range)  tamsulosin (FLOMAX) capsule 0.4 mg (0.4 mg Oral Given 08/15/23 0133)    Mobility manual wheelchair

## 2023-08-15 NOTE — Plan of Care (Signed)

## 2023-08-15 NOTE — Assessment & Plan Note (Signed)
S/p seeds On leuprolide

## 2023-08-16 DIAGNOSIS — N179 Acute kidney failure, unspecified: Secondary | ICD-10-CM

## 2023-08-16 LAB — BASIC METABOLIC PANEL
Anion gap: 8 (ref 5–15)
BUN: 23 mg/dL (ref 8–23)
CO2: 24 mmol/L (ref 22–32)
Calcium: 8.6 mg/dL — ABNORMAL LOW (ref 8.9–10.3)
Chloride: 106 mmol/L (ref 98–111)
Creatinine, Ser: 1.44 mg/dL — ABNORMAL HIGH (ref 0.61–1.24)
GFR, Estimated: 51 mL/min — ABNORMAL LOW (ref >60)
Glucose, Bld: 135 mg/dL — ABNORMAL HIGH (ref 70–99)
Potassium: 4.2 mmol/L (ref 3.5–5.1)
Sodium: 138 mmol/L (ref 135–145)

## 2023-08-16 MED ORDER — TAMSULOSIN HCL 0.4 MG PO CAPS: 0.4000 mg | ORAL_CAPSULE | Freq: Every day | ORAL | 0 refills | Status: AC

## 2023-08-16 MED ORDER — MYCOPHENOLATE MOFETIL 500 MG PO TABS: 1000.0000 mg | ORAL_TABLET | Freq: Every day | ORAL | 11 refills | Status: AC

## 2023-08-16 NOTE — Progress Notes (Signed)
Subjective: Blake [name] is doing well today with minimal discomfort.  He has not passed the stone.  His Cr is down to 1.44 with hydration.  ROS:  Review of Systems  Constitutional:  Negative for chills and fever.  Gastrointestinal:  Negative for nausea and vomiting.    Anti-infectives: Anti-infectives (From admission, onward)    None       Current Facility-Administered Medications  Medication Dose Route Frequency Provider Last Rate Last Admin   acetaminophen (TYLENOL) tablet 650 mg  650 mg Oral Q6H PRN Hillary Bow, DO   650 mg at 08/15/23 1410   Or   acetaminophen (TYLENOL) suppository 650 mg  650 mg Rectal Q6H PRN Hillary Bow, DO       enoxaparin (LOVENOX) injection 40 mg  40 mg Subcutaneous Q24H Lyda Perone M, DO   40 mg at 08/15/23 1853   HYDROmorphone (DILAUDID) injection 0.5-1 mg  0.5-1 mg Intravenous Q2H PRN Hillary Bow, DO       lactated ringers infusion   Intravenous Continuous Hillary Bow, DO 125 mL/hr at 08/16/23 0304 New Bag at 08/16/23 0304   mycophenolate (CELLCEPT) capsule 1,000 mg  1,000 mg Oral Daily Lyda Perone M, DO   1,000 mg at 08/15/23 1011   ondansetron (ZOFRAN) tablet 4 mg  4 mg Oral Q6H PRN Hillary Bow, DO       Or   ondansetron [facility]) injection 4 mg  4 mg Intravenous Q6H PRN Hillary Bow, DO       pantoprazole (PROTONIX) EC tablet 20 mg  20 mg Oral Daily Julian Reil, Jared M, DO   20 mg at 08/15/23 1011   tamsulosin (FLOMAX) capsule 0.4 mg  0.4 mg Oral Daily Lyda Perone M, DO         Objective: Vital signs in last 24 hours: Temp:  [97.3 F (36.3 C)-98.3 F (36.8 C)] 98.1 F (36.7 C) (08/18 0523) Pulse Rate:  [60-67] 61 (08/18 0523) Resp:  [18] 18 (08/18 0523) BP: (116-131)/(63-74) 116/67 (08/18 0523) SpO2:  [98 %-99 %] 98 % (08/18 0523)  Intake/Output from previous day: 08/17 0701 - 08/18 0700 In: 1832.1 [P.O.:240; I.V.:1592.1] Out: 2750 [Urine:2750] Intake/Output this shift: No intake/output data  recorded.   Physical Exam Vitals reviewed.  Constitutional:      Appearance: Normal appearance.  Cardiovascular:     Rate and Rhythm: Normal rate and regular rhythm.  Pulmonary:     Effort: Pulmonary effort is normal. No respiratory distress.  Abdominal:     General: Abdomen is flat.     Palpations: Abdomen is soft.     Tenderness: There is abdominal tenderness (minimal LLQ.).     Hernia: A hernia (umbilical) is present.  Neurological:     Mental Status: He is alert.     Lab Results:  Recent Labs    08/14/23 2149 08/15/23 0327  WBC 8.3 7.4  HGB 13.6 12.3*  HCT 40.3 37.0*  PLT 194 166   BMET Recent Labs    08/15/23 0327 08/16/23 0338  NA 137 138  K 4.2 4.2  CL 106 106  CO2 22 24  GLUCOSE 128* 135*  BUN 27* 23  CREATININE 1.79* 1.44*  CALCIUM 8.8* 8.6*   PT/INR Recent Labs    08/15/23 0327  LABPROT 14.0  INR 1.1   ABG No results for input(s): "PHART", "HCO3" in the last 72 hours.  Invalid input(s): "PCO2", "PO2"  Studies/Results: CT ABDOMEN PELVIS WO CONTRAST  Result Date: 08/15/2023 CLINICAL  DATA:  Left lower quadrant abdominal pain EXAM: CT ABDOMEN AND PELVIS WITHOUT CONTRAST TECHNIQUE: Multidetector CT imaging of the abdomen and pelvis was performed following the standard protocol without IV contrast. RADIATION DOSE REDUCTION: This exam was performed according to the departmental dose-optimization program which includes automated exposure control, adjustment of the mA and/or kV according to patient size and/or use of iterative reconstruction technique. COMPARISON:  PET/CT 01/07/2022 and MRI abdomen 11/07/2016 FINDINGS: Lower chest: No acute abnormality. Hepatobiliary: Hepatic cysts. No acute abnormality. No biliary dilation. Pancreas: Unremarkable. Spleen: Unremarkable. Adrenals/Urinary Tract: Normal adrenal glands. Low-attenuation lesions in the kidneys are statistically likely to represent cysts. No follow-up is required. 3 mm stone in the distal left  ureter with mild associated hydroureteronephrosis. Punctate nonobstructing left nephrolithiasis. Asymmetric left perinephric stranding. Unremarkable bladder. Stomach/Bowel: Normal caliber large and small bowel. No bowel wall thickening. Colonic diverticulosis without diverticulitis. Stomach and appendix are within normal limits. Vascular/Lymphatic: Aortic atherosclerosis. No enlarged abdominal or pelvic lymph nodes. Reproductive: Metallic seeds in the prostate. Other: No free intraperitoneal fluid or air. Fat containing umbilical hernia. Musculoskeletal: No acute fracture or destructive osseous lesion. Similar sclerosis about the left pubic body at the pubic symphysis. Left femoral head AVN. IMPRESSION: 1. 3 mm stone in the distal left ureter with mild associated hydroureteronephrosis. 2. Left femoral head AVN. Aortic Atherosclerosis (ICD10-I70.0). Electronically Signed   By: Minerva Fester M.D.   On: 08/15/2023 00:58     Assessment and Plan: Left distal stone with AKI.  His Cr is down to 1.44 and he has had minimal pain.   His options remain continued medical management vs ureteroscopy and I think with the decline in the Cr and minimal pain he would be safe for discharge on tamsulosin and pain med with f/u with Dr. Mena Goes in the next week or 2.         LOS: 1 day    Blake [name] 8/18/2024Patient ID: Blake [name], male   DOB: [birth year], 72 y.o.   MRN: [id number]

## 2023-08-16 NOTE — Discharge Summary (Signed)
Physician Discharge Summary  Blake [name] ZOX:[id number] DOB: [birth year] DOA: 08/14/2023  PCP: Mattie Marlin, DO  Admit date: 08/14/2023 Discharge date: 08/16/2023  Time spent: 20 minutes  Recommendations for Outpatient Follow-up:  Requires close outpatient follow-up with Dr. 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 Mena Goes Admitted with flank pain nausea AKI 3 mm distal ureteric stone and creatinine of 1.9 Because of flank pain nausea vomiting and history of this he was admitted-he was hydrated aggressively and urologist Dr. Annabell Howells saw the patient and discussed with him risk benefits options regarding management-patient elected conservative approach with Flomax-by day 2 of hospital stay patient's pain had completely resolved he was walking the unit passing urine without hematuria and looks stable He was discharged in a stable state for close follow-up with Dr. Mena Goes  Discharge Exam: Vitals:   08/15/23 1942 08/16/23 0523  BP: 128/74 116/67  Pulse: 60 61  Resp: 18 18  Temp: (!) 97.3 F (36.3 C) 98.1 F (36.7 C)  SpO2: 99% 98%    Subj on day of d/c   Awake coherent no distress walking the halls  General Exam on discharge  EOMI NCAT no focal deficit no icterus no pallor no rales no rhonchi Abdomen soft No flank tenderness No lower extremity edema Neuro intact  Discharge Instructions   Discharge Instructions     Diet - low sodium heart healthy   Complete by: As directed     Discharge instructions   Complete by: As directed    These force oral fluids, strain your urine, and follow-up closely with Dr. Mena Goes in the outpatient setting should you not passed the stone within 5 to 7 days For severe pain nausea vomiting I would recommend that you use Tylenol first choice ibuprofen second choice and if still not relieved with these 2 measures then I would recommend you go to the emergency room   Increase activity slowly   Complete by: As directed       Allergies as of 08/16/2023       Reactions   Azathioprine    Other reaction(s): Other (See Comments) Pt had vomiting and diarrheal episodes.        Medication List     TAKE these medications    CALCIUM 600/VITAMIN D PO Take 600 mg by mouth daily.   Lupron Depot (40-Month) 45 MG injection Generic drug: Leuprolide Acetate (6 Month) Inject 45 mg into the muscle every 6 (six) months. Last done 1 month to 6 weeks ago per pt on 04-23-2022   mycophenolate 500 MG tablet Commonly known as: CELLCEPT Take 2 tablets (1,000 mg total) by mouth daily.   omeprazole 10 MG capsule Commonly known as: PRILOSEC Take 10 mg by mouth daily.   tamsulosin 0.4 MG Caps capsule Commonly known as: FLOMAX Take 1 capsule (0.4 mg total) by mouth daily. Start taking on: August 17, 2023       Allergies  Allergen Reactions   Azathioprine     Other reaction(s): Other (  See Comments) Pt had vomiting and diarrheal episodes.      The results of significant diagnostics from this hospitalization (including imaging, microbiology, ancillary and laboratory) are listed below for reference.    Significant Diagnostic Studies: CT ABDOMEN PELVIS WO CONTRAST  Result Date: 08/15/2023 CLINICAL DATA:  Left lower quadrant abdominal pain EXAM: CT ABDOMEN AND PELVIS WITHOUT CONTRAST TECHNIQUE: Multidetector CT imaging of the abdomen and pelvis was performed following the standard protocol without IV contrast. RADIATION DOSE REDUCTION: This  exam was performed according to the departmental dose-optimization program which includes automated exposure control, adjustment of the mA and/or kV according to patient size and/or use of iterative reconstruction technique. COMPARISON:  PET/CT 01/07/2022 and MRI abdomen 11/07/2016 FINDINGS: Lower chest: No acute abnormality. Hepatobiliary: Hepatic cysts. No acute abnormality. No biliary dilation. Pancreas: Unremarkable. Spleen: Unremarkable. Adrenals/Urinary Tract: Normal adrenal glands. Low-attenuation lesions in the kidneys are statistically likely to represent cysts. No follow-up is required. 3 mm stone in the distal left ureter with mild associated hydroureteronephrosis. Punctate nonobstructing left nephrolithiasis. Asymmetric left perinephric stranding. Unremarkable bladder. Stomach/Bowel: Normal caliber large and small bowel. No bowel wall thickening. Colonic diverticulosis without diverticulitis. Stomach and appendix are within normal limits. Vascular/Lymphatic: Aortic atherosclerosis. No enlarged abdominal or pelvic lymph nodes. Reproductive: Metallic seeds in the prostate. Other: No free intraperitoneal fluid or air. Fat containing umbilical hernia. Musculoskeletal: No acute fracture or destructive osseous lesion. Similar sclerosis about the left pubic body at the pubic symphysis. Left femoral head AVN. IMPRESSION: 1. 3 mm stone in the distal left ureter with mild associated hydroureteronephrosis. 2. Left femoral head AVN. Aortic Atherosclerosis (ICD10-I70.0). Electronically Signed   By: Minerva Fester M.D.   On: 08/15/2023 00:58    Microbiology: No results found for this or any previous visit (from the past 240 hour(s)).   Labs: Basic Metabolic Panel: Recent Labs  Lab 08/14/23 2149 08/15/23 0327 08/16/23 0338  NA 138 137 138  K 4.6 4.2 4.2  CL 106 106 106  CO2 21* 22 24  GLUCOSE 123* 128* 135*  BUN 30* 27* 23  CREATININE 1.94* 1.79* 1.44*  CALCIUM 9.2 8.8* 8.6*   Liver Function  Tests: Recent Labs  Lab 08/14/23 2149  AST 29  ALT 21  ALKPHOS 94  BILITOT 0.8  PROT 7.5  ALBUMIN 4.4   Recent Labs  Lab 08/14/23 2149  LIPASE 28   No results for input(s): "AMMONIA" in the last 168 hours. CBC: Recent Labs  Lab 08/14/23 2149 08/15/23 0327  WBC 8.3 7.4  HGB 13.6 12.3*  HCT 40.3 37.0*  MCV 89.4 90.7  PLT 194 166   Cardiac Enzymes: No results for input(s): "CKTOTAL", "CKMB", "CKMBINDEX", "TROPONINI" in the last 168 hours. BNP: BNP (last 3 results) No results for input(s): "BNP" in the last 8760 hours.  ProBNP (last 3 results) No results for input(s): "PROBNP" in the last 8760 hours.  CBG: No results for input(s): "GLUCAP" in the last 168 hours.     Signed:  Rhetta Mura MD   [facility] 08/16/2023, 11:52 AM

## 2023-08-16 NOTE — Plan of Care (Signed)
  Problem: Education: Goal: Knowledge of General Education information will improve Description: Including pain rating scale, medication(s)/side effects and non-pharmacologic comfort measures Outcome: Progressing   Problem: Health Behavior/Discharge Planning: Goal: Ability to manage health-related needs will improve Outcome: Progressing   Problem: Clinical Measurements: Goal: Ability to maintain clinical measurements within normal limits will improve Outcome: Progressing Goal: Will remain free from infection Outcome: Progressing Goal: Diagnostic test results will improve Outcome: Progressing Goal: Respiratory complications will improve Outcome: Progressing Goal: Cardiovascular complication will be avoided Outcome: Progressing   Problem: Activity: Goal: Risk for activity intolerance will decrease Outcome: Progressing   Problem: Nutrition: Goal: Adequate nutrition will be maintained Outcome: Progressing   Problem: Coping: Goal: Level of anxiety will decrease Outcome: Progressing   Problem: Elimination: Goal: Will not experience complications related to bowel motility Outcome: Progressing Goal: Will not experience complications related to urinary retention Outcome: Progressing   Problem: Pain Managment: Goal: General experience of comfort will improve Outcome: Progressing   Problem: Safety: Goal: Ability to remain free from injury will improve Outcome: Progressing   Problem: Skin Integrity: Goal: Risk for impaired skin integrity will decrease Outcome: Progressing  Pt A/Ox4 on RA. Up ad lib. Urine strained overnight, no stones noted. IVF running. Minimal c/o L sided abd pain, pt utilized ice packs only.

## 2023-08-16 NOTE — Plan of Care (Signed)
Patient stable for discharge as per MD order., Patient verbalized understanding of discharge instructions. Patient show no signs or symptoms of acute distress at the time of discharge.  Problem: Education: Goal: Knowledge of General Education information will improve Description: Including pain rating scale, medication(s)/side effects and non-pharmacologic comfort measures Outcome: Adequate for Discharge   Problem: Health Behavior/Discharge Planning: Goal: Ability to manage health-related needs will improve Outcome: Adequate for Discharge   Problem: Clinical Measurements: Goal: Ability to maintain clinical measurements within normal limits will improve Outcome: Adequate for Discharge Goal: Will remain free from infection Outcome: Adequate for Discharge Goal: Diagnostic test results will improve Outcome: Adequate for Discharge Goal: Respiratory complications will improve Outcome: Adequate for Discharge Goal: Cardiovascular complication will be avoided Outcome: Adequate for Discharge   Problem: Activity: Goal: Risk for activity intolerance will decrease Outcome: Adequate for Discharge   Problem: Nutrition: Goal: Adequate nutrition will be maintained Outcome: Adequate for Discharge   Problem: Coping: Goal: Level of anxiety will decrease Outcome: Adequate for Discharge   Problem: Elimination: Goal: Will not experience complications related to bowel motility Outcome: Adequate for Discharge Goal: Will not experience complications related to urinary retention Outcome: Adequate for Discharge   Problem: Pain Managment: Goal: General experience of comfort will improve Outcome: Adequate for Discharge   Problem: Safety: Goal: Ability to remain free from injury will improve Outcome: Adequate for Discharge   Problem: Skin Integrity: Goal: Risk for impaired skin integrity will decrease Outcome: Adequate for Discharge

## 2023-08-18 LAB — MYCOPHENOLIC ACID (CELLCEPT)
MPA Glucuronide: 43 ug/mL (ref 15–125)
MPA: 3.8 ug/mL — ABNORMAL HIGH (ref 1.0–3.5)
